# Patient Record
Sex: Female | Born: 1935 | Race: White | Hispanic: Yes | Smoking: Never smoker
Health system: Southern US, Community
[De-identification: ages and names within clinical notes are randomized; demographics above are authoritative.]

## PROBLEM LIST (undated history)

## (undated) DIAGNOSIS — E079 Disorder of thyroid, unspecified: Secondary | ICD-10-CM

## (undated) DIAGNOSIS — G309 Alzheimer's disease, unspecified: Secondary | ICD-10-CM

## (undated) DIAGNOSIS — M1990 Unspecified osteoarthritis, unspecified site: Secondary | ICD-10-CM

## (undated) DIAGNOSIS — F0280 Dementia in other diseases classified elsewhere without behavioral disturbance: Secondary | ICD-10-CM

## (undated) HISTORY — PX: ABDOMINAL HYSTERECTOMY: SHX81

## (undated) HISTORY — DX: Unspecified osteoarthritis, unspecified site: M19.90

---

## 2016-02-28 ENCOUNTER — Ambulatory Visit (INDEPENDENT_AMBULATORY_CARE_PROVIDER_SITE_OTHER): Payer: Medicare (Managed Care) | Admitting: Family Medicine

## 2016-02-28 ENCOUNTER — Encounter: Payer: Self-pay | Admitting: Family Medicine

## 2016-02-28 VITALS — BP 128/62 | HR 66 | Temp 98.3°F | Resp 12 | Wt 115.0 lb

## 2016-02-28 DIAGNOSIS — Z23 Encounter for immunization: Secondary | ICD-10-CM

## 2016-02-28 DIAGNOSIS — R5383 Other fatigue: Secondary | ICD-10-CM

## 2016-02-28 DIAGNOSIS — R1030 Lower abdominal pain, unspecified: Secondary | ICD-10-CM | POA: Diagnosis not present

## 2016-02-28 DIAGNOSIS — Z7689 Persons encountering health services in other specified circumstances: Secondary | ICD-10-CM

## 2016-02-28 DIAGNOSIS — Z8639 Personal history of other endocrine, nutritional and metabolic disease: Secondary | ICD-10-CM

## 2016-02-28 DIAGNOSIS — G3184 Mild cognitive impairment, so stated: Secondary | ICD-10-CM | POA: Diagnosis not present

## 2016-02-28 DIAGNOSIS — F422 Mixed obsessional thoughts and acts: Secondary | ICD-10-CM | POA: Diagnosis not present

## 2016-02-28 DIAGNOSIS — M1990 Unspecified osteoarthritis, unspecified site: Secondary | ICD-10-CM

## 2016-02-28 MED ORDER — VENLAFAXINE HCL 25 MG PO TABS: 25.0000 mg | ORAL_TABLET | Freq: Every day | ORAL | 5 refills | Status: DC

## 2016-02-28 MED ORDER — GALANTAMINE HYDROBROMIDE 8 MG PO TABS: 8.0000 mg | ORAL_TABLET | Freq: Every day | ORAL | 5 refills | Status: DC
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
## 2016-02-29 DIAGNOSIS — Z23 Encounter for immunization: Secondary | ICD-10-CM | POA: Diagnosis not present

## 2016-03-01 ENCOUNTER — Telehealth: Payer: Self-pay | Admitting: Emergency Medicine

## 2016-03-01 DIAGNOSIS — E039 Hypothyroidism, unspecified: Secondary | ICD-10-CM

## 2016-03-01 LAB — COMPREHENSIVE METABOLIC PANEL
ALT: 13 IU/L (ref 0–32)
AST: 18 IU/L (ref 0–40)
Albumin/Globulin Ratio: 1.6 (ref 1.2–2.2)
Albumin: 4.7 g/dL (ref 3.5–4.7)
Alkaline Phosphatase: 83 IU/L (ref 39–117)
BILIRUBIN TOTAL: 0.3 mg/dL (ref 0.0–1.2)
BUN/Creatinine Ratio: 40 — ABNORMAL HIGH (ref 12–28)
BUN: 29 mg/dL — AB (ref 8–27)
CHLORIDE: 100 mmol/L (ref 96–106)
CO2: 23 mmol/L (ref 18–29)
Calcium: 10 mg/dL (ref 8.7–10.3)
Creatinine, Ser: 0.72 mg/dL (ref 0.57–1.00)
GFR calc non Af Amer: 77 mL/min/{1.73_m2} (ref >59)
GFR, EST AFRICAN AMERICAN: 89 mL/min/{1.73_m2} (ref >59)
GLUCOSE: 93 mg/dL (ref 65–99)
Globulin, Total: 2.9 g/dL (ref 1.5–4.5)
Potassium: 5.1 mmol/L (ref 3.5–5.2)
Sodium: 146 mmol/L — ABNORMAL HIGH (ref 134–144)
TOTAL PROTEIN: 7.6 g/dL (ref 6.0–8.5)

## 2016-03-01 LAB — CBC WITH DIFFERENTIAL/PLATELET
BASOS ABS: 0 10*3/uL (ref 0.0–0.2)
Basos: 0 %
EOS (ABSOLUTE): 0.1 10*3/uL (ref 0.0–0.4)
Eos: 1 %
Hematocrit: 40.4 % (ref 34.0–46.6)
Hemoglobin: 13.1 g/dL (ref 11.1–15.9)
IMMATURE GRANS (ABS): 0 10*3/uL (ref 0.0–0.1)
IMMATURE GRANULOCYTES: 0 %
LYMPHS: 16 %
Lymphocytes Absolute: 1.5 10*3/uL (ref 0.7–3.1)
MCH: 27.1 pg (ref 26.6–33.0)
MCHC: 32.4 g/dL (ref 31.5–35.7)
MCV: 84 fL (ref 79–97)
Monocytes Absolute: 0.6 10*3/uL (ref 0.1–0.9)
Monocytes: 6 %
NEUTROS PCT: 77 %
Neutrophils Absolute: 7.5 10*3/uL — ABNORMAL HIGH (ref 1.4–7.0)
PLATELETS: 291 10*3/uL (ref 150–379)
RBC: 4.84 x10E6/uL (ref 3.77–5.28)
RDW: 14.1 % (ref 12.3–15.4)
WBC: 9.8 10*3/uL (ref 3.4–10.8)

## 2016-03-01 LAB — URINALYSIS, COMPLETE
Bilirubin, UA: NEGATIVE
Glucose, UA: NEGATIVE
KETONES UA: NEGATIVE
NITRITE UA: NEGATIVE
RBC, UA: NEGATIVE
Specific Gravity, UA: 1.027 (ref 1.005–1.030)
UUROB: 0.2 mg/dL (ref 0.2–1.0)
pH, UA: 5 (ref 5.0–7.5)

## 2016-03-01 LAB — MICROSCOPIC EXAMINATION

## 2016-03-01 LAB — URINE CULTURE

## 2016-03-01 LAB — TSH: TSH: 8.96 u[IU]/mL — AB (ref 0.450–4.500)

## 2016-03-01 MED ORDER — LEVOTHYROXINE SODIUM 25 MCG PO TABS: 25.0000 ug | ORAL_TABLET | Freq: Every day | ORAL | 12 refills | Status: DC

## 2016-03-01 NOTE — Telephone Encounter (Signed)
pt daughter informed. Med sent in.

## 2016-03-01 NOTE — Telephone Encounter (Signed)
-----   Message from Maple Hudsonichard L Gilbert Jr., MD sent at 03/01/2016  7:40 AM EST ----- Labs OK--mildly dehydrated--push fluids a little. Mildly hypothyroid--synthroid 25mcg daily.

## 2016-03-04 ENCOUNTER — Other Ambulatory Visit: Payer: Self-pay

## 2016-03-04 ENCOUNTER — Telehealth: Payer: Self-pay | Admitting: Family Medicine

## 2016-03-04 NOTE — Telephone Encounter (Signed)
Pt's son in law called saying he thinks pt had a bad reaction to the venlafaxine (EFFEXOR) 25 MG tablet  He said last night she had to be restrained. She was combative.  Please advise.  [phone]  Thanks Barth Kirksteri

## 2016-03-04 NOTE — Telephone Encounter (Signed)
I do not think it is from the Effexor but go ahead and stop it anyway. She is on a  very small dose. I think the patient is actually sundowning with her dementia.

## 2016-03-04 NOTE — Telephone Encounter (Signed)
Not a UTI by culture.

## 2016-03-04 NOTE — Telephone Encounter (Signed)
Please review-aa 

## 2016-03-04 NOTE — Telephone Encounter (Signed)
Gary-son in Social workerlaw, Bonneyadvised.-aa

## 2016-03-04 NOTE — Telephone Encounter (Signed)
Pt daughter informed. [place] and she said that it could be a UTI. Please review her urine culture.   Call Jillyn HiddenGary (son in law) at [phone] with further info.

## 2016-03-11 ENCOUNTER — Ambulatory Visit (INDEPENDENT_AMBULATORY_CARE_PROVIDER_SITE_OTHER): Payer: Medicare (Managed Care) | Admitting: Family Medicine

## 2016-03-11 ENCOUNTER — Ambulatory Visit: Payer: Medicare (Managed Care) | Admitting: Family Medicine

## 2016-03-11 VITALS — BP 88/46 | HR 94 | Temp 97.7°F | Resp 16

## 2016-03-11 DIAGNOSIS — E039 Hypothyroidism, unspecified: Secondary | ICD-10-CM | POA: Diagnosis not present

## 2016-03-11 DIAGNOSIS — G301 Alzheimer's disease with late onset: Secondary | ICD-10-CM

## 2016-03-11 DIAGNOSIS — F0280 Dementia in other diseases classified elsewhere without behavioral disturbance: Secondary | ICD-10-CM | POA: Diagnosis not present

## 2016-03-11 NOTE — Progress Notes (Signed)
Alejandra Armstrong  MRN: [id number] DOB: 10/21/1929  Subjective:  HPI   The patient is an 82 year old female who presents for follow up of her last visit.  The daughter is present and reports that the patient became very delusional after beginning the medication that she was started on during the last visit.  They have discontinued all those medications.  She seems to be slightly better.  The patient reports she is feeling sleeping well and feels well emotionally.  The daughter reports that the patient has been going to bed at 5 pm and then waking at 9 pm and having trouble with sleeping the rest of the night.She Is presently on no medications.  Patient Active Problem List   Diagnosis Date Noted  . Arthritis 02/28/2016    Past Medical History:  Diagnosis Date  . Arthritis     Social History   Social History  . Marital status: Widowed    Spouse name: N/A  . Number of children: N/A  . Years of education: N/A   Occupational History  . Not on file.   Social History Main Topics  . Smoking status: Never Smoker  . Smokeless tobacco: Never Used  . Alcohol use No  . Drug use: No  . Sexual activity: No   Other Topics Concern  . Not on file   Social History Narrative  . No narrative on file    Outpatient Encounter Prescriptions as of 03/11/2016  Medication Sig  . galantamine (RAZADYNE) 8 MG tablet Take 1 tablet (8 mg total) by mouth daily.  Marland Kitchen. levothyroxine (SYNTHROID, LEVOTHROID) 25 MCG tablet Take 1 tablet (25 mcg total) by mouth daily before breakfast.   No facility-administered encounter medications on file as of 03/11/2016.     Allergies  Allergen Reactions  . Venlafaxine Hcl Other (See Comments)    Hallucinations, combative behavior    Review of Systems  Constitutional: Negative.   HENT: Negative.   Eyes: Negative.   Respiratory: Negative.   Cardiovascular: Negative.   Gastrointestinal: Negative.   Genitourinary: Negative.   Skin: Negative.     Endo/Heme/Allergies: Negative.   Psychiatric/Behavioral: Positive for memory loss.    Objective:  BP (!) 88/46   Pulse 94   Temp 97.7 F (36.5 C) (Oral)   Resp 16   Physical Exam  Constitutional: She is well-developed, well-nourished, and in no distress.  HENT:  Head: Normocephalic and atraumatic.  Right Ear: External ear normal.  Left Ear: External ear normal.  Nose: Nose normal.  Eyes: Conjunctivae are normal. Pupils are equal, round, and reactive to light.  Neck: Normal range of motion.  Cardiovascular: Normal rate, regular rhythm and normal heart sounds.   Pulmonary/Chest: Effort normal and breath sounds normal.  Abdominal: Soft.  Musculoskeletal: She exhibits no edema or deformity.  Neurological: She is alert. No cranial nerve deficit. Gait normal. Coordination normal.  Skin: Skin is warm and dry.  Psychiatric: Mood and affect normal.    Assessment and Plan :  Alzheimer's dementia We'll try to get MMSE on next visit. Top all medications for now. May need to try to get placement for assisted living due to social situation. Both the daughter and son-in-law continue to work and the grandson is in school. For  disrupted sleep we'll try to put her put her bed time back from 5 PM to 9 PM. I doubt it will be successful but it is worth a try. Hypothyroidism TSH sometime early in 2018.  I have  done the exam and reviewed the chart and it is accurate to the best of my knowledge. Dentist[employer]agon  technology has been used and  any errors in dictation or transcription are unintentional. Julieanne Mansonichard  M.D. [facility]  Return to clinic 1 month.   The patient has discontinued all her medication and at this point she states she feels well.  We will follow her thyroid level and adjust according. The patient has been advised to try and stay awake until later in the evening. Family has been advised to gradually keep her awake a little longer each  night.

## 2016-03-27 ENCOUNTER — Telehealth: Payer: Self-pay

## 2016-03-27 DIAGNOSIS — Z111 Encounter for screening for respiratory tuberculosis: Secondary | ICD-10-CM

## 2016-03-27 NOTE — Telephone Encounter (Signed)
TB quantiferon test ordered. Lab slip printed at front desk. Jillyn HiddenGary advised.

## 2016-03-27 NOTE — Telephone Encounter (Signed)
Easiest for them would be Quantiferon gold blood test.

## 2016-03-27 NOTE — Telephone Encounter (Signed)
Patient's son in law Sena HitchGary Walters is requesting a order to have a TB test done. [facility] will be [facility] soon and it is a requirement along with a FL2 form. Jillyn HiddenGary will drop off the form to be completed. CB# [phone]

## 2016-04-03 ENCOUNTER — Other Ambulatory Visit: Payer: Self-pay

## 2016-04-03 DIAGNOSIS — F422 Mixed obsessional thoughts and acts: Secondary | ICD-10-CM

## 2016-04-03 DIAGNOSIS — E039 Hypothyroidism, unspecified: Secondary | ICD-10-CM

## 2016-04-03 DIAGNOSIS — F0280 Dementia in other diseases classified elsewhere without behavioral disturbance: Secondary | ICD-10-CM

## 2016-04-03 DIAGNOSIS — G301 Alzheimer's disease with late onset: Principal | ICD-10-CM

## 2016-04-05 ENCOUNTER — Telehealth: Payer: Self-pay | Admitting: Family Medicine

## 2016-04-05 NOTE — Telephone Encounter (Signed)
Pt's son in law called wanting to get the TB skin test results.  Please advise [phone]  Thanks teri

## 2016-04-06 LAB — QUANTIFERON IN TUBE
QFT TB AG MINUS NIL VALUE: 0.04 [IU]/mL
QUANTIFERON MITOGEN VALUE: 2.78 IU/mL
QUANTIFERON NIL VALUE: 0.03 [IU]/mL
QUANTIFERON TB AG VALUE: 0.07 IU/mL
QUANTIFERON TB GOLD: NEGATIVE

## 2016-04-06 LAB — QUANTIFERON TB GOLD ASSAY (BLOOD)

## 2016-04-08 NOTE — Telephone Encounter (Signed)
Alejandra HiddenGary walters advised-aa

## 2016-04-16 ENCOUNTER — Ambulatory Visit: Payer: Medicare (Managed Care) | Admitting: Family Medicine

## 2016-07-03 ENCOUNTER — Telehealth: Payer: Self-pay | Admitting: Family Medicine

## 2016-07-03 NOTE — Telephone Encounter (Signed)
It came in last week or at the end of other week and I placed it for you to review on green chart. If you could please check. Thank you-aa

## 2016-07-03 NOTE — Telephone Encounter (Signed)
Jillyn Hidden calling to check stats on the 2 forms that were dropped off about 2 week ago to be filled out by Dr. Please call Jillyn Hidden when ready for pick up.  CB# [phone] Thanks CC

## 2016-07-10 ENCOUNTER — Telehealth: Payer: Self-pay

## 2016-07-10 NOTE — Telephone Encounter (Signed)
I had a copy so I placed another copy on the green chart for you to review please-aa

## 2016-07-10 NOTE — Telephone Encounter (Signed)
Family is again calling for the form they dropped of to be filled out.  Please call Sena Hitch when form is ready at [phone]

## 2016-07-16 ENCOUNTER — Telehealth: Payer: Self-pay | Admitting: Family Medicine

## 2016-07-16 NOTE — Telephone Encounter (Signed)
Called Pt to schedule AWV with [facility] - knb °

## 2016-07-30 ENCOUNTER — Telehealth: Payer: Self-pay | Admitting: Family Medicine

## 2016-07-30 NOTE — Telephone Encounter (Signed)
Called Pt to schedule AWV with [facility] - knb °

## 2017-01-03 ENCOUNTER — Emergency Department: Payer: Medicare Other

## 2017-01-03 ENCOUNTER — Emergency Department
Admission: EM | Admit: 2017-01-03 | Discharge: 2017-01-03 | Disposition: A | Discharge status: Home / Self Care | Payer: Medicare Other | Attending: Student in an Organized Health Care Education/Training Program | Admitting: Student in an Organized Health Care Education/Training Program

## 2017-01-03 DIAGNOSIS — R55 Syncope and collapse: Secondary | ICD-10-CM | POA: Diagnosis present

## 2017-01-03 DIAGNOSIS — G308 Other Alzheimer's disease: Secondary | ICD-10-CM | POA: Insufficient documentation

## 2017-01-03 DIAGNOSIS — E039 Hypothyroidism, unspecified: Secondary | ICD-10-CM | POA: Insufficient documentation

## 2017-01-03 HISTORY — DX: Disorder of thyroid, unspecified: E07.9

## 2017-01-03 HISTORY — DX: Dementia in other diseases classified elsewhere, unspecified severity, without behavioral disturbance, psychotic disturbance, mood disturbance, and anxiety: F02.80

## 2017-01-03 HISTORY — DX: Alzheimer's disease, unspecified: G30.9

## 2017-01-03 LAB — CBC
HCT: 37 % (ref 35.0–47.0)
Hemoglobin: 12 g/dL (ref 12.0–16.0)
MCH: 26.1 pg (ref 26.0–34.0)
MCHC: 32.4 g/dL (ref 32.0–36.0)
MCV: 80.7 fL (ref 80.0–100.0)
PLATELETS: 228 10*3/uL (ref 150–440)
RBC: 4.58 MIL/uL (ref 3.80–5.20)
RDW: 15.5 % — ABNORMAL HIGH (ref 11.5–14.5)
WBC: 5.9 10*3/uL (ref 3.6–11.0)

## 2017-01-03 LAB — URINALYSIS, COMPLETE (UACMP) WITH MICROSCOPIC
BACTERIA UA: NONE SEEN
Bilirubin Urine: NEGATIVE
GLUCOSE, UA: NEGATIVE mg/dL
HGB URINE DIPSTICK: NEGATIVE
KETONES UR: 5 mg/dL — AB
Nitrite: NEGATIVE
PROTEIN: 100 mg/dL — AB
Specific Gravity, Urine: 1.021 (ref 1.005–1.030)
pH: 5 (ref 5.0–8.0)

## 2017-01-03 LAB — BASIC METABOLIC PANEL
Anion gap: 7 (ref 5–15)
BUN: 19 mg/dL (ref 6–20)
CALCIUM: 9.2 mg/dL (ref 8.9–10.3)
CO2: 27 mmol/L (ref 22–32)
CREATININE: 1.01 mg/dL — AB (ref 0.44–1.00)
Chloride: 109 mmol/L (ref 101–111)
GFR calc non Af Amer: 49 mL/min — ABNORMAL LOW (ref >60)
GFR, EST AFRICAN AMERICAN: 57 mL/min — AB (ref >60)
Glucose, Bld: 99 mg/dL (ref 65–99)
Potassium: 3.8 mmol/L (ref 3.5–5.1)
SODIUM: 143 mmol/L (ref 135–145)

## 2017-01-03 LAB — GLUCOSE, CAPILLARY: GLUCOSE-CAPILLARY: 102 mg/dL — AB (ref 65–99)

## 2017-01-03 LAB — TROPONIN I: Troponin I: <0.03 ng/mL (ref <0.03)

## 2017-01-03 MED ORDER — SODIUM CHLORIDE 0.9 % IV BOLUS (SEPSIS)
500.0000 mL | Freq: Once | INTRAVENOUS | Status: AC
  Administered 2017-01-03: 500 mL via INTRAVENOUS

## 2017-01-03 NOTE — ED Provider Notes (Signed)
-----------------------------------------   8:53 AM on 01/03/2017 -----------------------------------------  Patient's labs are largely within normal limits. Patient's symptoms are suggestive of vasovagal/orthostatic syncope. With a normal workup as the patient continues to appear very well in the emergency department we will discharge home with PCP follow-up. Awaiting Spanish interpreter.   Minna Antis, MD 01/03/17 [phone]

## 2017-01-03 NOTE — ED Notes (Signed)
Patient transported to CT 

## 2017-01-03 NOTE — ED Triage Notes (Signed)
Patient coming from [facility]. 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 Patient had syncopal episode while being ambulated by staff to bathroom. Patient was eased down to floor by staff.

## 2017-01-03 NOTE — ED Notes (Signed)
Spoke with Director Liborio Nixon from [place] with update on patient. Liborio Nixon reported [facility] will come and pick up patient when she is ready for d/c

## 2017-01-03 NOTE — ED Provider Notes (Signed)
[facility] Emergency Department Provider Note    First MD Initiated Contact with Patient 01/03/17 [phone]     (approximate)  I have reviewed the triage vital signs and the nursing notes.   HISTORY  Chief Complaint Loss of Consciousness  Level V Caveat: Dementia  HPI Alejandra Armstrong is a 83 y.o. female presents from [facility]. Patient has a history of Alzheimer's dementia. She is being assisted to the restroom early this morningwhen she had a witnessed near syncopal episode where she reportedly tightened up and staff had to help her to the floor. There is no shaking episode to suggest seizure activity. She did move her bowels. Patient was hemodynamically stable in route. No recent changes to any medications. Cannot provide any additional history due to dementia.   Past Medical History:  Diagnosis Date  . Alzheimer's dementia   . Arthritis   . Thyroid disease    hypothyroidism   Family History  Problem Relation Age of Onset  . Dementia Mother   . Depression Father   . Dementia Sister   . Colon cancer Sister    Past Surgical History:  Procedure Laterality Date  . ABDOMINAL HYSTERECTOMY     Patient Active Problem List   Diagnosis Date Noted  . Late onset Alzheimer's disease without behavioral disturbance 04/03/2016  . Hypothyroidism 04/03/2016  . Mixed obsessional thoughts and acts 04/03/2016  . Arthritis 02/28/2016      Prior to Admission medications   Medication Sig Start Date End Date Taking? Authorizing Provider  galantamine (RAZADYNE) 8 MG tablet Take 1 tablet (8 mg total) by mouth daily. Patient not taking: Reported on 03/11/2016 02/28/16   Maple Hudson., MD  levothyroxine (SYNTHROID, LEVOTHROID) 25 MCG tablet Take 1 tablet (25 mcg total) by mouth daily before breakfast. Patient not taking: Reported on 03/11/2016 03/01/16   Maple Hudson., MD    Allergies Venlafaxine hcl    Social  History Social History  Substance Use Topics  . Smoking status: Never Smoker  . Smokeless tobacco: Never Used  . Alcohol use No    Review of Systems Patient denies headaches, rhinorrhea, blurry vision, numbness, shortness of breath, chest pain, edema, cough, abdominal pain, nausea, vomiting, diarrhea, dysuria, fevers, rashes or hallucinations unless otherwise stated above in HPI. ____________________________________________   PHYSICAL EXAM:  VITAL SIGNS: Vitals:   01/03/17 0605 01/03/17 0608  BP:  (!) 113/56  Pulse:  74  Resp:  19  Temp:  99.2 F (37.3 C)  SpO2: 94% 95%    Constitutional: Alert and in no acute distress. Eyes: Conjunctivae are normal.  Head: Atraumatic. Nose: No congestion/rhinnorhea. Mouth/Throat: Mucous membranes are moist.   Neck: No stridor. Painless ROM.  Cardiovascular: Normal rate, regular rhythm. Grossly normal heart sounds.  Good peripheral circulation. Respiratory: Normal respiratory effort.  No retractions. Lungs CTAB. Gastrointestinal: Soft and nontender. No distention. No abdominal bruits. No CVA tenderness. Genitourinary: normal external genitalia Musculoskeletal: No lower extremity tenderness nor edema.  No joint effusions. Neurologic:  No gross focal neurologic deficits are appreciated. No facial droop Skin:  Skin is warm, dry and intact. No rash noted. Psychiatric: appropriate  ____________________________________________   LABS (all labs ordered are listed, but only abnormal results are displayed)  Results for orders placed or performed during the hospital encounter of 01/03/17 (from the past 24 hour(s))  Glucose, capillary     Status: Abnormal   Collection Time: 01/03/17  6:15 AM  [facility] Value Ref  Range   Glucose-Capillary 102 (H) 65 - 99 mg/dL   ____________________________________________  EKG My review and personal interpretation at Time: 6:09   Indication: syncope  Rate: 75  Rhythm: sinus Axis: normal Other: normal  intervals, no stemi, no bradycardia or blocks. ____________________________________________  RADIOLOGY  I personally reviewed all radiographic images ordered to evaluate for the above acute complaints and reviewed radiology reports and findings.  These findings were personally discussed with the patient.  Please see medical record for radiology report.  ____________________________________________   PROCEDURES  Procedure(s) performed:  Procedures    Critical Care performed: no ____________________________________________   INITIAL IMPRESSION / ASSESSMENT AND PLAN / ED COURSE  Pertinent labs & imaging results that were available during my care of the patient were reviewed by me and considered in my medical decision making (see chart for details).  DDX: vasovagal, dehydration, cva, bleed, dysrhythmia  Alejandra Armstrong is a 83 y.o. who presents to the ED with syncopal episode as described above. Patient frail appearing but nontoxic. No hypoxia. She is well-perfused. EKG shows no evidence of acute ischemia. No bradycardia or abnormal intervals. Stool is guaiac negative. No evidence of UTI. Patient was moving her bowels during episode therefore do suspect some component of vasovagal. We'll give IV fluids. CT head ordered to evaluate for bleed or CVA shows no acute abnormality. Chest x-ray shows no evidence of congestive heart failure or pneumothorax. Patient will be signed out to Dr. Dennie Bible housekeeping pending results of BMP and troponin and reassessment. I do anticipate the patient will be stable for discharge back to facility.      ____________________________________________   FINAL CLINICAL IMPRESSION(S) / ED DIAGNOSES  Final diagnoses:  Fainting spell      NEW MEDICATIONS STARTED DURING THIS VISIT:  New Prescriptions   No medications on file     Note:  This document was prepared using Dragon voice recognition software and may include unintentional dictation  errors.    Willy Eddy, MD 01/03/17 [phone]

## 2017-01-13 ENCOUNTER — Emergency Department: Payer: Medicare Other

## 2017-01-13 ENCOUNTER — Observation Stay
Admission: EM | Admit: 2017-01-13 | Discharge: 2017-01-15 | Disposition: A | Discharge status: Home / Self Care | Payer: Medicare Other | Attending: Internal Medicine | Admitting: Internal Medicine

## 2017-01-13 ENCOUNTER — Encounter: Payer: Self-pay | Admitting: *Deleted

## 2017-01-13 DIAGNOSIS — Z23 Encounter for immunization: Secondary | ICD-10-CM | POA: Diagnosis not present

## 2017-01-13 DIAGNOSIS — N390 Urinary tract infection, site not specified: Secondary | ICD-10-CM | POA: Diagnosis not present

## 2017-01-13 DIAGNOSIS — F0281 Dementia in other diseases classified elsewhere with behavioral disturbance: Secondary | ICD-10-CM | POA: Insufficient documentation

## 2017-01-13 DIAGNOSIS — Z79899 Other long term (current) drug therapy: Secondary | ICD-10-CM | POA: Insufficient documentation

## 2017-01-13 DIAGNOSIS — G309 Alzheimer's disease, unspecified: Secondary | ICD-10-CM | POA: Diagnosis not present

## 2017-01-13 DIAGNOSIS — E039 Hypothyroidism, unspecified: Secondary | ICD-10-CM | POA: Diagnosis not present

## 2017-01-13 DIAGNOSIS — R4182 Altered mental status, unspecified: Secondary | ICD-10-CM | POA: Diagnosis present

## 2017-01-13 DIAGNOSIS — E860 Dehydration: Secondary | ICD-10-CM | POA: Diagnosis not present

## 2017-01-13 DIAGNOSIS — M1990 Unspecified osteoarthritis, unspecified site: Secondary | ICD-10-CM | POA: Insufficient documentation

## 2017-01-13 DIAGNOSIS — Z888 Allergy status to other drugs, medicaments and biological substances status: Secondary | ICD-10-CM | POA: Diagnosis not present

## 2017-01-13 DIAGNOSIS — R0602 Shortness of breath: Secondary | ICD-10-CM

## 2017-01-13 DIAGNOSIS — G9341 Metabolic encephalopathy: Secondary | ICD-10-CM | POA: Insufficient documentation

## 2017-01-13 LAB — COMPREHENSIVE METABOLIC PANEL
ALT: 9 U/L — AB (ref 14–54)
ANION GAP: 10 (ref 5–15)
AST: 29 U/L (ref 15–41)
Albumin: 3.7 g/dL (ref 3.5–5.0)
Alkaline Phosphatase: 80 U/L (ref 38–126)
BUN: 12 mg/dL (ref 6–20)
CHLORIDE: 106 mmol/L (ref 101–111)
CO2: 23 mmol/L (ref 22–32)
CREATININE: 1.07 mg/dL — AB (ref 0.44–1.00)
Calcium: 9.5 mg/dL (ref 8.9–10.3)
GFR, EST AFRICAN AMERICAN: 53 mL/min — AB (ref >60)
GFR, EST NON AFRICAN AMERICAN: 46 mL/min — AB (ref >60)
Glucose, Bld: 122 mg/dL — ABNORMAL HIGH (ref 65–99)
POTASSIUM: 5.2 mmol/L — AB (ref 3.5–5.1)
Sodium: 139 mmol/L (ref 135–145)
Total Bilirubin: 1.2 mg/dL (ref 0.3–1.2)
Total Protein: 7.6 g/dL (ref 6.5–8.1)

## 2017-01-13 LAB — URINALYSIS, ROUTINE W REFLEX MICROSCOPIC
BILIRUBIN URINE: NEGATIVE
Glucose, UA: NEGATIVE mg/dL
Ketones, ur: NEGATIVE mg/dL
Nitrite: NEGATIVE
PH: 6 (ref 5.0–8.0)
Protein, ur: 100 mg/dL — AB
SPECIFIC GRAVITY, URINE: 1.015 (ref 1.005–1.030)

## 2017-01-13 LAB — CBC
HCT: 40 % (ref 35.0–47.0)
Hemoglobin: 13.1 g/dL (ref 12.0–16.0)
MCH: 26.1 pg (ref 26.0–34.0)
MCHC: 32.6 g/dL (ref 32.0–36.0)
MCV: 79.9 fL — AB (ref 80.0–100.0)
PLATELETS: 226 10*3/uL (ref 150–440)
RBC: 5.01 MIL/uL (ref 3.80–5.20)
RDW: 15.5 % — ABNORMAL HIGH (ref 11.5–14.5)
WBC: 10.7 10*3/uL (ref 3.6–11.0)

## 2017-01-13 LAB — MRSA PCR SCREENING: MRSA by PCR: NEGATIVE

## 2017-01-13 MED ORDER — MEMANTINE HCL 5 MG PO TABS
10.0000 mg | ORAL_TABLET | Freq: Two times a day (BID) | ORAL | Status: DC
  Administered 2017-01-14 – 2017-01-15 (×3): 10 mg via ORAL
  Filled 2017-01-13 (×4): qty 2

## 2017-01-13 MED ORDER — CEFTRIAXONE SODIUM IN DEXTROSE 20 MG/ML IV SOLN
1.0000 g | INTRAVENOUS | Status: DC
  Filled 2017-01-13: qty 50

## 2017-01-13 MED ORDER — DEXTROSE 5 % IV SOLN
1.0000 g | INTRAVENOUS | Status: DC
  Administered 2017-01-14: 1 g via INTRAVENOUS
  Filled 2017-01-13 (×2): qty 10

## 2017-01-13 MED ORDER — TRAZODONE HCL 50 MG PO TABS: 12.5000 mg | ORAL_TABLET | Freq: Four times a day (QID) | ORAL | Status: DC | PRN

## 2017-01-13 MED ORDER — SODIUM CHLORIDE 0.9 % IV SOLN
INTRAVENOUS | Status: DC
  Administered 2017-01-13: 14:00:00 via INTRAVENOUS

## 2017-01-13 MED ORDER — ONDANSETRON HCL 4 MG PO TABS
4.0000 mg | ORAL_TABLET | Freq: Four times a day (QID) | ORAL | Status: DC | PRN
  Administered 2017-01-14: 4 mg via ORAL
  Filled 2017-01-13: qty 1

## 2017-01-13 MED ORDER — LEVOTHYROXINE SODIUM 50 MCG PO TABS
100.0000 ug | ORAL_TABLET | Freq: Every day | ORAL | Status: DC
  Administered 2017-01-14 – 2017-01-15 (×2): 100 ug via ORAL
  Filled 2017-01-13 (×2): qty 2

## 2017-01-13 MED ORDER — ACETAMINOPHEN 650 MG RE SUPP: 650.0000 mg | Freq: Four times a day (QID) | RECTAL | Status: DC | PRN

## 2017-01-13 MED ORDER — TRAZODONE HCL 50 MG PO TABS
25.0000 mg | ORAL_TABLET | Freq: Every day | ORAL | Status: DC
  Administered 2017-01-14: 25 mg via ORAL
  Filled 2017-01-13 (×2): qty 1

## 2017-01-13 MED ORDER — ENOXAPARIN SODIUM 40 MG/0.4ML ~~LOC~~ SOLN: 40.0000 mg | SUBCUTANEOUS | Status: DC

## 2017-01-13 MED ORDER — CEFTRIAXONE SODIUM IN DEXTROSE 20 MG/ML IV SOLN
1.0000 g | Freq: Once | INTRAVENOUS | Status: AC
  Administered 2017-01-13: 1 g via INTRAVENOUS
  Filled 2017-01-13: qty 50

## 2017-01-13 MED ORDER — INFLUENZA VAC SPLIT HIGH-DOSE 0.5 ML IM SUSY
0.5000 mL | PREFILLED_SYRINGE | INTRAMUSCULAR | Status: AC
  Administered 2017-01-15: 0.5 mL via INTRAMUSCULAR
  Filled 2017-01-13: qty 0.5

## 2017-01-13 MED ORDER — TRAZODONE 25 MG HALF TABLET
12.5000 mg | ORAL_TABLET | Freq: Four times a day (QID) | ORAL | Status: DC | PRN
  Filled 2017-01-13: qty 1

## 2017-01-13 MED ORDER — SERTRALINE HCL 50 MG PO TABS
75.0000 mg | ORAL_TABLET | Freq: Every day | ORAL | Status: DC
  Administered 2017-01-14 – 2017-01-15 (×2): 75 mg via ORAL
  Filled 2017-01-13 (×2): qty 2

## 2017-01-13 MED ORDER — ONDANSETRON HCL 4 MG/2ML IJ SOLN: 4.0000 mg | Freq: Four times a day (QID) | INTRAMUSCULAR | Status: DC | PRN

## 2017-01-13 MED ORDER — VITAMIN D 1000 UNITS PO TABS
2000.0000 [IU] | ORAL_TABLET | Freq: Every day | ORAL | Status: DC
Start: 2017-01-13 — End: 2017-01-15
  Administered 2017-01-14 – 2017-01-15 (×2): 2000 [IU] via ORAL
  Filled 2017-01-13 (×2): qty 2

## 2017-01-13 MED ORDER — ACETAMINOPHEN 325 MG PO TABS: 650.0000 mg | ORAL_TABLET | Freq: Four times a day (QID) | ORAL | Status: DC | PRN

## 2017-01-13 MED ORDER — RISPERIDONE 0.25 MG PO TABS
0.7500 mg | ORAL_TABLET | Freq: Every day | ORAL | Status: DC
  Administered 2017-01-14: 0.75 mg via ORAL
  Filled 2017-01-13 (×2): qty 3

## 2017-01-13 NOTE — ED Notes (Signed)
A woman stating she was patient's daughter called to get information, states that she in on the way and will arrive between 1430-1530. Woman states that her name is Belva Cromeydia Walters and that she is patient's daughter. Explained HIPPA to patient's daughter and that all I could say over the phone without verification is that patient is here, going to be admitted, and that patient is stable.

## 2017-01-13 NOTE — H&P (Signed)
[employer] [facility] [place] [facility]   PATIENT NAME: Alejandra Armstrong    MR#:  [id number]  DATE OF BIRTH:  05/09/1929  DATE OF ADMISSION:  01/13/2017  PRIMARY CARE PHYSICIAN: Maple HudsonGilbert, Richard L Jr., MD   REQUESTING/REFERRING PHYSICIAN: Dr. Lenard LancePaduchowski  CHIEF COMPLAINT:   Chief Complaint  Patient presents with  . Fall  . Altered Mental Status    HISTORY OF PRESENT ILLNESS:  Alejandra CatchingsLuz Keep  is a 83 y.o. female with a known history of Alzheimer's dementia, hypothyroidism, osteoarthritis who presents from assisted living due to altered mental status. Patient herself has baseline dementia therefore most of the history obtained from the chart and from the ER physician. I attempted to call [facility] but did not get an answer from them. Patient presented to the emergency room was noted to have a urinary tract infection based on urinalysis. She is afebrile, with a normal white cell count and hemodynamically stable. Given her mental status change and metabolic encephalopathy hospitalist services were contacted further treatment and evaluation.  PAST MEDICAL HISTORY:   Past Medical History:  Diagnosis Date  . Alzheimer's dementia   . Arthritis   . Thyroid disease    hypothyroidism    PAST SURGICAL HISTORY:   Past Surgical History:  Procedure Laterality Date  . ABDOMINAL HYSTERECTOMY      SOCIAL HISTORY:   Social History  Substance Use Topics  . Smoking status: Never Smoker  . Smokeless tobacco: Never Used  . Alcohol use No    FAMILY HISTORY:   Family History  Problem Relation Age of Onset  . Dementia Mother   . Depression Father   . Dementia Sister   . Colon cancer Sister     DRUG ALLERGIES:   Allergies  Allergen Reactions  . Venlafaxine Hcl Other (See Comments)    Hallucinations, combative behavior    REVIEW OF SYSTEMS:   Review of Systems  Unable to perform ROS: Dementia    MEDICATIONS AT HOME:   Prior to Admission medications    Medication Sig Start Date End Date Taking? Authorizing Provider  Cholecalciferol (VITAMIN D3) 2000 units TABS Take 2,000 Units by mouth daily.   Yes [provider]  levothyroxine (SYNTHROID, LEVOTHROID) 100 MCG tablet Take 100 mcg by mouth daily before breakfast.   Yes [provider]  memantine (NAMENDA) 10 MG tablet Take 10 mg by mouth 2 (two) times daily.   Yes [provider]  risperiDONE (RISPERDAL) 0.5 MG tablet Take 0.75 mg by mouth at bedtime.   Yes [provider]  sertraline (ZOLOFT) 50 MG tablet Take 75 mg by mouth daily.   Yes [provider]  traZODone (DESYREL) 50 MG tablet Take 25 mg by mouth at bedtime.   Yes [provider]  traZODone (DESYREL) 50 MG tablet Take 12.5 mg by mouth every 6 (six) hours as needed (Agitation).   Yes [provider]  galantamine (RAZADYNE) 8 MG tablet Take 1 tablet (8 mg total) by mouth daily. Patient not taking: Reported on 03/11/2016 02/28/16   Maple HudsonGilbert, Richard L Jr., MD  levothyroxine (SYNTHROID, LEVOTHROID) 25 MCG tablet Take 1 tablet (25 mcg total) by mouth daily before breakfast. Patient not taking: Reported on 03/11/2016 03/01/16   Maple HudsonGilbert, Richard L Jr., MD      VITAL SIGNS:  Blood pressure (!) 110/51, pulse 77, temperature (!) 97.4 F (36.3 C), temperature source Oral, resp. rate 14, height 5' (1.524 m), weight 52.2 kg (115 lb), SpO2 99 %.  PHYSICAL EXAMINATION:  Physical Exam  GENERAL:  83 y.o.-year-old patient lying in the bed lethargic/encephalopathic EYES: Pupils equal, round, reactive to light and accommodation. No scleral icterus. Extraocular muscles intact.  HEENT: Head atraumatic, normocephalic. Oropharynx and nasopharynx clear. No oropharyngeal erythema, dry oral mucosa  NECK:  Supple, no jugular venous distention. No thyroid enlargement, no tenderness.  LUNGS: Normal breath sounds bilaterally, no wheezing, rales, rhonchi. No use of accessory muscles of respiration.   CARDIOVASCULAR: S1, S2 RRR. No murmurs, rubs, gallops, clicks.  ABDOMEN: Soft, nontender, nondistended. Bowel sounds present. No organomegaly or mass.  EXTREMITIES: No pedal edema, cyanosis, or clubbing. + 2 pedal & radial pulses b/l.   NEUROLOGIC: Cranial nerves II through XII are intact. No focal Motor or sensory deficits appreciated b/l. Globally weak & Encephalopathic. Responds to painful and verbal stimuli. PSYCHIATRIC: The patient is alert and oriented x 1.  SKIN: No obvious rash, lesion, or ulcer.   LABORATORY PANEL:   CBC  Recent Labs Lab 01/13/17 0707  WBC 10.7  HGB 13.1  HCT 40.0  PLT 226   ------------------------------------------------------------------------------------------------------------------  Chemistries   Recent Labs Lab 01/13/17 0707  NA 139  K 5.2*  CL 106  CO2 23  GLUCOSE 122*  BUN 12  CREATININE 1.07*  CALCIUM 9.5  AST 29  ALT 9*  ALKPHOS 80  BILITOT 1.2   ------------------------------------------------------------------------------------------------------------------  Cardiac Enzymes No results for input(s): TROPONINI in the last 168 hours. ------------------------------------------------------------------------------------------------------------------  RADIOLOGY:  Ct Head Wo Contrast  Result Date: 01/13/2017 CLINICAL DATA:  Altered mental status EXAM: CT HEAD WITHOUT CONTRAST TECHNIQUE: Contiguous axial images were obtained from the base of the skull through the vertex without intravenous contrast. COMPARISON:  05/06/2016 FINDINGS: Brain: There is atrophy and chronic small vessel disease changes. No acute intracranial abnormality. Specifically, no hemorrhage, hydrocephalus, mass lesion, acute infarction, or significant intracranial injury. Vascular: No hyperdense vessel or unexpected calcification. Skull: No acute calvarial abnormality. Sinuses/Orbits: Visualized paranasal sinuses and mastoids clear. Orbital soft tissues unremarkable.  Other: None IMPRESSION: No acute intracranial abnormality. Atrophy, chronic microvascular disease. Electronically Signed   By: Charlett Nose M.D.   On: 01/13/2017 08:12     IMPRESSION AND PLAN:   83 year old female with past medical history of Alzheimer's dementia, hypothyroidism, osteoarthritis who presents to the hospital due to altered mental status.  1. Altered mental status-metabolic encephalopathy secondary to urinary tract infection. Patient CT head was negative for acute pathology. -We'll treat the patient with IV fluids, IV ceftriaxone for UTI and follow mental status.  2. Urinary tract infection-based on urinalysis. We'll treat the patient with IV ceftriaxone, follow urine cultures.  3. Hypothyroidism-continue Synthroid.  4. Dementia with behavioral disturbance-continue Namenda, Risperdal, Zoloft.    All the records are reviewed and case discussed with ED provider. Management plans discussed with the patient, family and they are in agreement.  CODE STATUS: Full code  TOTAL TIME TAKING CARE OF THIS PATIENT: 45 minutes.    Houston Siren M.D on 01/13/2017 at 11:14 AM  Between 7am to 6pm - Pager - [phone]  After 6pm go to [email or web address] - password [email or web address] [facility]  [facility]  Office  [phone]  CC: Primary care physician; Maple Hudson., MD

## 2017-01-13 NOTE — ED Provider Notes (Signed)
[facility] Emergency Department Provider Note  Time seen: 7:17 AM  I have reviewed the triage vital signs and the nursing notes.   HISTORY  Chief Complaint Fall and Altered Mental Status    HPI Alejandra Armstrong is a 83 y.o. female with a past medical history of dementia, arthritis, hypothyroidism, presents to the emergency department for altered mental status.  According EMS report the patient had a fall from a seated position this morning hitting the right side of her head.  EMS also reports today nursing home staff states the patient is less interactive and responsive than normal.  Has baseline dementia which is significant but is normally interactive.  Today the patient is not very interactive.  Patient cannot contribute to her history.  Patient is somnolent but no distress.  Opens eyes to voice.   Past Medical History:  Diagnosis Date  . Alzheimer's dementia   . Arthritis   . Thyroid disease    hypothyroidism    Patient Active Problem List   Diagnosis Date Noted  . Late onset Alzheimer's disease without behavioral disturbance 04/03/2016  . Hypothyroidism 04/03/2016  . Mixed obsessional thoughts and acts 04/03/2016  . Arthritis 02/28/2016    Past Surgical History:  Procedure Laterality Date  . ABDOMINAL HYSTERECTOMY      Prior to Admission medications   Medication Sig Start Date End Date Taking? Authorizing Provider  galantamine (RAZADYNE) 8 MG tablet Take 1 tablet (8 mg total) by mouth daily. Patient not taking: Reported on 03/11/2016 02/28/16   Maple Hudson., MD  levothyroxine (SYNTHROID, LEVOTHROID) 25 MCG tablet Take 1 tablet (25 mcg total) by mouth daily before breakfast. Patient not taking: Reported on 03/11/2016 03/01/16   Maple Hudson., MD    Allergies  Allergen Reactions  . Venlafaxine Hcl Other (See Comments)    Hallucinations, combative behavior    Family History  Problem Relation Age of Onset  . Dementia  Mother   . Depression Father   . Dementia Sister   . Colon cancer Sister     Social History Social History  Substance Use Topics  . Smoking status: Never Smoker  . Smokeless tobacco: Never Used  . Alcohol use No    Review of Systems  Unable to obtain an adequate/accurate review of systems due to underlying dementia.  ____________________________________________   PHYSICAL EXAM:  VITAL SIGNS: ED Triage Vitals  Enc Vitals Group     BP 01/13/17 0643 136/68     Pulse Rate 01/13/17 0643 87     Resp 01/13/17 0643 18     Temp 01/13/17 0643 (!) 97.4 F (36.3 C)     Temp Source 01/13/17 0643 Oral     SpO2 01/13/17 0635 98 %     Weight 01/13/17 0644 115 lb (52.2 kg)     Height 01/13/17 0644 5' (1.524 m)     Head Circumference --      Peak Flow --      Pain Score --      Pain Loc --      Pain Edu? --      Excl. in GC? --     Constitutional: Somnolent but does open eyes to voice.  No distress. Eyes: Normal exam ENT   Head: Normocephalic and atraumatic.  No hematoma or abrasion.   Mouth/Throat: Mucous membranes are moist. Cardiovascular: Normal rate, regular rhythm.  Respiratory: Normal respiratory effort without tachypnea nor retractions. Breath sounds are clear  Gastrointestinal: Soft  and nontender. No distention.   Musculoskeletal: Nontender with normal range of motion in all extremities.  Neurologic: Somnolent, does not speak.  We will not follow commands.  Will open eyes to voice.  Unable to adequately evaluate neurological exam at this time. Skin:  Skin is warm, dry and intact.  Psychiatric: Somnolent.  ____________________________________________    EKG  EKG reviewed and interpreted by myself appears to show a regular/sinus rhythm at 87 bpm with a narrow QRS, normal axis, largely normal intervals and nonspecific ST changes.  Mild electrical interference, no significant ST elevation.  ____________________________________________    RADIOLOGY  CT shows  no acute abnormality  ____________________________________________   INITIAL IMPRESSION / ASSESSMENT AND PLAN / ED COURSE  Pertinent labs & imaging results that were available during my care of the patient were reviewed by me and considered in my medical decision making (see chart for details).  Patient presents to the emergency department with reported altered mental status.  Patient does have baseline dementia.  Differential this time would include head injury, ICH, electrolyte abnormality, infectious etiology, dementia.  We will obtain a CT scan of the head, labs and closely monitor.  I have reviewed the patient's records, patient was last seen in the emergency department approximately 10 days ago.  At that time the dementia is also well documented that the patient is unable to contribute to her history.  Labs are significant for a urinary tract infection.  We will dose IV Rocephin and continue to closely monitor.  CT negative.  Urinalysis consistent with urinary tract infection with white blood cell clumps.  Patient remains extremely somnolent but does awaken to voice or light physical stimuli.  Falls back asleep shortly after awakening.  Given her altered mental status with increased somnolence and urinary tract infection we will admit to the hospital for continued IV antibiotics.  ____________________________________________   FINAL CLINICAL IMPRESSION(S) / ED DIAGNOSES  Altered mental status Urinary tract infection    Minna AntisPaduchowski, Quetzali Heinle, MD 01/13/17 1041

## 2017-01-13 NOTE — Clinical Social Work Note (Addendum)
Clinical Social Work Assessment  Patient Details  Name: Alejandra Armstrong MRN: [id number] Date of Birth: 03/28/1929  Date of referral:  01/13/17               Reason for consult:  Other (Comment Required) (From [facility] )                Permission sought to share information with:  Facility Industrial/product designerContact Representative Permission granted to share information::  Yes, Verbal Permission Granted  Name::      [facility]   Agency::     Relationship::     Contact Information:     Housing/Transportation Living arrangements for the past 2 months:  Assisted Living Facility Source of Information:  Adult Children, Facility Patient Interpreter Needed:  None Criminal Activity/Legal Involvement Pertinent to Current Situation/Hospitalization:  No - Comment as needed Significant Relationships:  Adult Children Lives with:  Facility Resident Do you feel safe going back to the place where you live?    Need for family participation in patient care:  Yes (Comment)  Care giving concerns:  [facility] located at [address], [place] (fax: [phone]).     Social Worker assessment / plan:  Visual merchandiserClinical Social Worker (CSW) reviewed chart and noted that [facility]. Per chart patient is not alert and oriented X4, so CSW contacted her daughter Alejandra Armstrong. Per daughter patient has been a [facility] since Jan[facility], [facility]. Per daughter she is patient's HPOA and lives in [place]. Per daughter patient has dementia and is more confused now then normally. Per daughter patient can speak english however when she is confused patient can only understand and speak spanish. Daughter reported that patient walks without an assistive device at baseline. Daughter is agreeable for patient to return to [facility]. [facility] and made her aware of above. Per Alejandra NixonJanice patient is in  the memory care unit and can return when stable. Per Alejandra NixonJanice patient is on room air at baseline and walks without an assistive device. Per Alejandra NixonJanice if patient needs home health they prefer [facility]. CSW will continue to follow and assist as needed.   Employment status:  Retired Health and safety inspectornsurance information:  Medicare PT Recommendations:  Not assessed at this time Information / Referral to community resources:  Other (Comment Required) (Patient will return to ALF )  Patient/Family's Response to care:  Patient's daughter is agreeable for patient to return to [facility].    Patient/Family's Understanding of and Emotional Response to Diagnosis, Current Treatment, and Prognosis:  Patient's daughter was very pleasant and thanked CSW for assistance.   Emotional Assessment Appearance:  Appears stated age Attitude/Demeanor/Rapport:  Unable to Assess Affect (typically observed):  Unable to Assess Orientation:  Oriented to Self, Fluctuating Orientation (Suspected and/or reported Sundowners) Alcohol / Substance use:  Not Applicable Psych involvement (Current and /or in the community):  No (Comment)  Discharge Needs  Concerns to be addressed:  Discharge Planning Concerns Readmission within the last 30 days:  No Current discharge risk:  Cognitively Impaired Barriers to Discharge:  Continued Medical Work up   [employer], Alejandra CrockerBailey M, LCSW 01/13/2017, 4:02 PM

## 2017-01-13 NOTE — Progress Notes (Signed)
[facility] but realized patient is too confused to be admitted to current room.  Plan to admit to 159 after room is cleaned.

## 2017-01-13 NOTE — ED Notes (Signed)
Patient is resting comfortably. 

## 2017-01-13 NOTE — ED Notes (Addendum)
This RN to bedside at this time. Pt noted to have removed her clothes and her IV. Pt placed back in gown, VS obtained by this RN. Pt covered up with a blanket. Pt is noted to be mumbling under her breath in Spanish, does not actively participate in care with this RN when this RN speaks to patient. Pt noted to be pulling at heart monitor at this time.

## 2017-01-13 NOTE — ED Triage Notes (Signed)
Pt presents via EMS after falling from a seated position in the dining room of the facility where she lives. Pt is unable to participate in health care, unable to provide health history or confirm it. Pt is reported to normally interact w/ caregivers, regardless of understanding. Pt is non-interactive at the time of triage. Unable to establish if pt is having pain. Report from NH staff state pt has AMS after fall where she struck the R side of her head.

## 2017-01-13 NOTE — Care Management (Signed)
Patient from [facility]. Alejandra NixonJanice with [facility] called and would like to speak with CSW. CSW updated.

## 2017-01-13 NOTE — NC FL2 (Signed)
[place] MEDICAID FL2 LEVEL OF CARE SCREENING TOOL     IDENTIFICATION  Patient Name: Alejandra Armstrong Birthdate: 1930/01/06 Sex: female Admission Date (Current Location): 01/13/2017  [place] Number:  Chiropodist and Address:  [facility], [address] [place], [place] [address]      Provider Number: [id number]  Attending Physician Name and Address:  Houston Siren, MD  Relative Name and Phone Number:       Current Level of Care: Hospital Recommended Level of Care: ALF Memory Care  Prior Approval Number:    Date Approved/Denied:   PASRR Number: [id number]  Discharge Plan: ALF: Memory Care      Current Diagnoses: Primary: Dementia Patient Active Problem List   Diagnosis Date Noted  . Altered mental status 01/13/2017  . Late onset Alzheimer's disease without behavioral disturbance 04/03/2016  . Hypothyroidism 04/03/2016  . Mixed obsessional thoughts and acts 04/03/2016  . Arthritis 02/28/2016    Orientation RESPIRATION BLADDER Height & Weight     Self  Room Air  Incontinent Weight: 114 lb 12.8 oz (52.1 kg) Height:  5' (152.4 cm)  BEHAVIORAL SYMPTOMS/MOOD NEUROLOGICAL BOWEL NUTRITION STATUS      Continent Regular Diet   AMBULATORY STATUS COMMUNICATION OF NEEDS Skin   Extensive Assist Verbally Normal                       Personal Care Assistance Level of Assistance  Bathing, Feeding, Dressing Bathing Assistance: Limited assistance Feeding assistance: Independent Dressing Assistance: Limited assistance     Functional Limitations Info  Sight, Hearing, Speech Sight Info: Adequate Hearing Info: Adequate Speech Info: Adequate    SPECIAL CARE FACTORS FREQUENCY  PT (By licensed PT)     PT 2-3 home health               Contractures      Additional Factors Info  Code Status, Allergies Code Status Info:  (Full Code. ) Allergies Info:  (Venlafaxine Hcl)           Current Medications  (01/13/2017):  This is the current hospital active medication list Current Facility-Administered Medications  Medication Dose Route Frequency Provider Last Rate Last Dose  . 0.9 %  sodium chloride infusion   Intravenous Continuous Houston Siren, MD 75 mL/hr at 01/13/17 1428    . acetaminophen (TYLENOL) tablet 650 mg  650 mg Oral Q6H PRN Houston Siren, MD       Or  . acetaminophen (TYLENOL) suppository 650 mg  650 mg Rectal Q6H PRN Houston Siren, MD      . cefTRIAXone (ROCEPHIN) 1 g in dextrose 5 % 50 mL IVPB - Premix  1 g Intravenous Q24H Sainani, Rolly Pancake, MD      . cholecalciferol (VITAMIN D) tablet 2,000 Units  2,000 Units Oral Daily Sainani, Rolly Pancake, MD      . levothyroxine (SYNTHROID, LEVOTHROID) tablet 100 mcg  100 mcg Oral QAC breakfast Houston Siren, MD      . memantine [facility]) tablet 10 mg  10 mg Oral BID Houston Siren, MD      . ondansetron (ZOFRAN) tablet 4 mg  4 mg Oral Q6H PRN Houston Siren, MD       Or  . ondansetron (ZOFRAN) injection 4 mg  4 mg Intravenous Q6H PRN Houston Siren, MD      . risperiDONE (RISPERDAL) tablet 0.75 mg  0.75 mg Oral QHS  Houston SirenSainani, Vivek J, MD      . sertraline (ZOLOFT) tablet 75 mg  75 mg Oral Daily Houston SirenSainani, Vivek J, MD      . traZODone (DESYREL) tablet 25 mg  25 mg Oral QHS Houston SirenSainani, Vivek J, MD      . traZODone (DESYREL) tablet 25 mg  25 mg Oral Q6H PRN Houston SirenSainani, Vivek J, MD         Discharge Medications: Please see discharge summary for a list of discharge medications.  Relevant Imaging Results:  Relevant Lab Results:   Additional Information  (SSN: [id number])  Mckenna Gamm, Darleen CrockerBailey M, LCSW

## 2017-01-13 NOTE — ED Notes (Signed)
This RN noted by Orvilla Fusommy, EDT-P that patient removed 2nd IV placed.

## 2017-01-13 NOTE — ED Notes (Signed)
Safety mittens placed on patient's hands at this time to prevent patient from pulling out her IV.

## 2017-01-13 NOTE — ED Notes (Signed)
Adult mittens placed on pt's hands, ensuring they were not too tight.

## 2017-01-13 NOTE — ED Notes (Signed)
ED Provider at bedside. 

## 2017-01-13 NOTE — ED Notes (Signed)
This RN notified by Amy, RN that patient was going to be moved to room 159 for best visual access of patient due to dementia. Stat clean was being placed on room. Maxine GlennMonica, Consulting civil engineerCharge RN notified.

## 2017-01-13 NOTE — ED Notes (Signed)
Pt resting in bed, interpreter used for IV placement and blood draw, pt resting with eyes closed, pt not verbally responding to anything but her name

## 2017-01-14 ENCOUNTER — Inpatient Hospital Stay: Payer: Medicare Other

## 2017-01-14 LAB — CBC
HEMATOCRIT: 39.7 % (ref 35.0–47.0)
Hemoglobin: 12.7 g/dL (ref 12.0–16.0)
MCH: 25.8 pg — AB (ref 26.0–34.0)
MCHC: 32 g/dL (ref 32.0–36.0)
MCV: 80.7 fL (ref 80.0–100.0)
Platelets: 250 10*3/uL (ref 150–440)
RBC: 4.92 MIL/uL (ref 3.80–5.20)
RDW: 15.1 % — AB (ref 11.5–14.5)
WBC: 6.4 10*3/uL (ref 3.6–11.0)

## 2017-01-14 LAB — BASIC METABOLIC PANEL
Anion gap: 10 (ref 5–15)
BUN: 14 mg/dL (ref 6–20)
CHLORIDE: 107 mmol/L (ref 101–111)
CO2: 26 mmol/L (ref 22–32)
Calcium: 9.4 mg/dL (ref 8.9–10.3)
Creatinine, Ser: 0.93 mg/dL (ref 0.44–1.00)
GFR calc Af Amer: >60 mL/min (ref >60)
GFR, EST NON AFRICAN AMERICAN: 54 mL/min — AB (ref >60)
GLUCOSE: 93 mg/dL (ref 65–99)
POTASSIUM: 4 mmol/L (ref 3.5–5.1)
Sodium: 143 mmol/L (ref 135–145)

## 2017-01-14 NOTE — Progress Notes (Signed)
Patient changed from inpatient to observation today. PT is recommending SNF. Clinical Education officer, museum (CSW) contacted [employer] and made her aware of above. Per Thayer Headings she will accept patient back at [facility] memory care with home health PT through [facility]. RN case manager aware of above.   CSW contacted patient's daughter Cornelius Moras and made her aware that medicare will not pay for short term rehab at a SNF. CSW explained private pay option. Per daughter patient can't pay privately for SNF and patient just got approved for special assistance medicaid for an ALF memory care unit. CSW explained that patient can D/C back to [facility] with home health and daughter could follow up with DSS about changing medicaid over to long term care SNF if that is what she needs long term. Daughter verbalized her understanding and is agreeable for patient to return to [facility].   CSW also met with patient's daughter's partner Dominica Severin at bedside at daughter's request and made him aware of above. Plan is for patient to D/C back to [facility] tomorrow pending medical clearance. CSW will continue to follow and assist as needed.   [employer], LCSW [phone]

## 2017-01-14 NOTE — Evaluation (Signed)
Physical Therapy Evaluation Patient Details Name: Alejandra Armstrong MRN: [id number] DOB: 12/14/1929 Today's Date: 01/14/2017   History of Present Illness  Pt is an 83 y.o.femalewith a known history of Alzheimer's dementia, hypothyroidism, osteoarthritis who presents from assisted living due to altered mental status.  Patient presented to the emergency room was noted to have a urinary tract infection based on urinalysis. She is afebrile, with a normal white cell count and hemodynamically stable. Given her mental status change and metabolic encephalopathy hospitalist services were contacted further treatment and evaluation.  Assessment includes: AMS with metabolic encephalopathy secondary to UTI, hypothyroidism, and dementia with behavioral disturbance.      Clinical Impression  Pt presents with deficits in strength, transfers, mobility, gait, balance, and activity tolerance.  Pt able to follow simple commands with assistance from pt's nurse assisting as spanish interpreter.  Pt required assist to get upper body and BLEs out of bed and to assist for proper positioning in sitting.  Pt required +2 HHA for safety with transfers and amb along with min to mod A to prevent LOB.  Pt's functional mobility and gait deficits appear pronounced relative to pt's baseline.  Pt will benefit from PT services in a SNF setting upon discharge to safely address above deficits for decreased caregiver assistance and eventual return to PLOF.      Follow Up Recommendations SNF    Equipment Recommendations  Other (comment) (TBD at next venue of care)    Recommendations for Other Services       Precautions / Restrictions Precautions Precautions: Fall Restrictions Weight Bearing Restrictions: No      Mobility  Bed Mobility Overal bed mobility: Needs Assistance Bed Mobility: Sit to Supine       Sit to supine: Mod assist   General bed mobility comments: Mod assist for upper and lower body out of bed and  into proper positioning  Transfers Overall transfer level: Needs assistance Equipment used: 2 person hand held assist Transfers: Sit to/from Stand Sit to Stand: +2 physical assistance;Mod assist         General transfer comment: Assist required for both standing from EOB and to prevent posterior LOB once in standing  Ambulation/Gait Ambulation/Gait assistance: Min assist Ambulation Distance (Feet): 3 Feet Assistive device: 2 person hand held assist Gait Pattern/deviations: Step-to pattern   Gait velocity interpretation: <1.8 ft/sec, indicative of risk for recurrent falls General Gait Details: Very slow cadence and small B step length with constant min A to prevent posterior LOB  Stairs            Wheelchair Mobility    Modified Rankin (Stroke Patients Only)       Balance Overall balance assessment: Needs assistance Sitting-balance support: Bilateral upper extremity supported;Feet supported Sitting balance-Leahy Scale: Fair     Standing balance support: Bilateral upper extremity supported Standing balance-Leahy Scale: Poor Standing balance comment: Posterior instability in standing                             Pertinent Vitals/Pain Pain Assessment: No/denies pain    Home Living Family/patient expects to be discharged to:: Skilled nursing facility                      Prior Function Level of Independence: Needs assistance   Gait / Transfers Assistance Needed: Per chart review family reported pt able to amb in memory care facility without AD  ADL's / [facility]  Needed: Pt a resident at [facility] memory [facility] unit        Hand Dominance        Extremity/Trunk Assessment   Upper Extremity Assessment Upper Extremity Assessment: Generalized weakness;Difficult to assess due to impaired cognition    Lower Extremity Assessment Lower Extremity Assessment: Generalized weakness;Difficult to assess due to impaired  cognition       Communication   Communication: Interpreter utilized Hotel manager assisted as interpreter)  Cognition Arousal/Alertness: Lethargic Behavior During Therapy: WFL for tasks assessed/performed Overall Cognitive Status: No family/caregiver present to determine baseline cognitive functioning                                        General Comments      Exercises     Assessment/Plan    PT Assessment Patient needs continued PT services  PT Problem List Decreased strength;Decreased activity tolerance;Decreased balance;Decreased mobility       PT Treatment Interventions DME instruction;Gait training;Functional mobility training;Neuromuscular re-education;Balance training;Therapeutic exercise;Therapeutic activities;Patient/family education    PT Goals (Current goals can be found in the Care Plan section)  Acute Rehab PT Goals PT Goal Formulation: Patient unable to participate in goal setting Time For Goal Achievement: 01/27/17 Potential to Achieve Goals: Fair    Frequency Min 2X/week   Barriers to discharge Inaccessible home environment      Co-evaluation               AM-PAC PT "6 Clicks" Daily Activity  Outcome Measure Difficulty turning over in bed (including adjusting bedclothes, sheets and blankets)?: Unable Difficulty moving from lying on back to sitting on the side of the bed? : Unable Difficulty sitting down on and standing up from a chair with arms (e.g., wheelchair, bedside commode, etc,.)?: Unable Help needed moving to and from a bed to chair (including a wheelchair)?: Total Help needed walking in hospital room?: Total Help needed climbing 3-5 steps with a railing? : Total 6 Click Score: 6    End of Session Equipment Utilized During Treatment: Gait belt;Oxygen Activity Tolerance: Patient limited by fatigue Patient left: in chair;with chair alarm set;with nursing/sitter in room;with call bell/phone within reach Nurse  Communication: Mobility status PT Visit Diagnosis: Unsteadiness on feet (R26.81);Difficulty in walking, not elsewhere classified (R26.2);Muscle weakness (generalized) (M62.81)    Time: 0950-1010 PT Time Calculation (min) (ACUTE ONLY): 20 min   Charges:   PT Evaluation $PT Eval Low Complexity: 1 Low     PT G Codes:   PT G-Codes **NOT FOR INPATIENT CLASS** Functional Assessment Tool Used: AM-PAC 6 Clicks Basic Mobility Functional Limitation: Mobility: Walking and moving around Mobility: Walking and Moving Around Current Status (A2130): 100 percent impaired, limited or restricted Mobility: Walking and Moving Around Goal Status (Q6578): At least 40 percent but less than 60 percent impaired, limited or restricted    D. Scott Woodie Trusty PT, DPT 01/14/17, 1:34 PM

## 2017-01-14 NOTE — Progress Notes (Signed)
[facility] [place] [facility]   PATIENT NAME: Alejandra Armstrong    MR#:  [id number]  DATE OF BIRTH:  12/16/1929  SUBJECTIVE:  Patient was brought in from [facility] assisted living with increasing confusion she was found to have UTI.  She is more awake alert eating.  Sitter in the room she appears to be calm  REVIEW OF SYSTEMS:   Review of Systems  Unable to perform ROS: Dementia   Tolerating Diet:yes Tolerating PT: snf  DRUG ALLERGIES:   Allergies  Allergen Reactions  . Venlafaxine Hcl Other (See Comments)    Hallucinations, combative behavior    VITALS:  Blood pressure (!) 136/58, pulse 90, temperature 98.2 F (36.8 C), temperature source Oral, resp. rate 20, height 5' (1.524 m), weight 52.1 kg (114 lb 12.8 oz), SpO2 98 %.  PHYSICAL EXAMINATION:   Physical Exam  GENERAL:  83 y.o.-year-old patient lying in the bed with no acute distress.  EYES: Pupils equal, round, reactive to light and accommodation. No scleral icterus. Extraocular muscles intact.  HEENT: Head atraumatic, normocephalic. Oropharynx and nasopharynx clear.  NECK:  Supple, no jugular venous distention. No thyroid enlargement, no tenderness.  LUNGS: Normal breath sounds bilaterally, no wheezing, rales, rhonchi. No use of accessory muscles of respiration.  CARDIOVASCULAR: S1, S2 normal. No murmurs, rubs, or gallops.  ABDOMEN: Soft, nontender, nondistended. Bowel sounds present. No organomegaly or mass.  EXTREMITIES: No cyanosis, clubbing or edema b/l.    NEUROLOGIC: Cranial nerves II through XII are intact. No focal Motor or sensory deficits b/l.   PSYCHIATRIC:  patient is alert and dementia.  SKIN: No obvious rash, lesion, or ulcer.   LABORATORY PANEL:  CBC  Recent Labs Lab 01/14/17 0452  WBC 6.4  HGB 12.7  HCT 39.7  PLT 250    Chemistries   Recent Labs Lab 01/13/17 0707 01/14/17 0452  NA 139 143  K 5.2* 4.0  CL 106 107  CO2 23 26  GLUCOSE 122* 93  BUN 12 14   CREATININE 1.07* 0.93  CALCIUM 9.5 9.4  AST 29  --   ALT 9*  --   ALKPHOS 80  --   BILITOT 1.2  --    Cardiac Enzymes No results for input(s): TROPONINI in the last 168 hours. RADIOLOGY:  Ct Head Wo Contrast  Result Date: 01/13/2017 CLINICAL DATA:  Altered mental status EXAM: CT HEAD WITHOUT CONTRAST TECHNIQUE: Contiguous axial images were obtained from the base of the skull through the vertex without intravenous contrast. COMPARISON:  05/06/2016 FINDINGS: Brain: There is atrophy and chronic small vessel disease changes. No acute intracranial abnormality. Specifically, no hemorrhage, hydrocephalus, mass lesion, acute infarction, or significant intracranial injury. Vascular: No hyperdense vessel or unexpected calcification. Skull: No acute calvarial abnormality. Sinuses/Orbits: Visualized paranasal sinuses and mastoids clear. Orbital soft tissues unremarkable. Other: None IMPRESSION: No acute intracranial abnormality. Atrophy, chronic microvascular disease. Electronically Signed   By: Charlett NoseKevin  Dover M.D.   On: 01/13/2017 08:12   ASSESSMENT AND PLAN:  83 year old female with past medical history of Alzheimer's dementia, hypothyroidism, osteoarthritis who presents to the hospital due to altered mental status.  1. Altered mental status-metabolic encephalopathy secondary to urinary tract infection. Patient CT head was negative for acute pathology.  -We'll treat the patient with IV fluids, IV ceftriaxone for UTI and follow mental status. -pt is currently on RA. CXr today  2. Urinary tract infection-based on urinalysis. We'll treat the patient with IV ceftriaxone  3. Hypothyroidism-continue Synthroid.  4. Dementia  with behavioral disturbance-continue Namenda, Risperdal, Zoloft.  Pt recommends SNf D/w dter Isabelle Course on the phone  Case discussed with Care Management/Social Worker. Management plans discussed with the patient, family and they are in agreement.  CODE STATUS: Full  DVT  Prophylaxis: lovenox TOTAL TIME TAKING CARE OF THIS PATIENT: *25* minutes.  >50% time spent on counselling and coordination of care  POSSIBLE D/C IN *1-2 DAYS, DEPENDING ON CLINICAL CONDITION.  Note: This dictation was prepared with Dragon dictation along with smaller phrase technology. Any transcriptional errors that result from this process are unintentional.  Nihaal Friesen M.D on 01/14/2017 at 2:12 PM  Between 7am to 6pm - Pager - [phone]  After 6pm go to [email or web address] - Social research officer, government  [facility]  Office  [phone]  CC: Primary care physician; Maple Hudson., MD

## 2017-01-14 NOTE — Plan of Care (Signed)
Problem: Respiratory: Goal: Ability to maintain adequate ventilation will improve Outcome: Progressing Clear lung sounds on auscultation. Patient now on room air Sats 97%.

## 2017-01-14 NOTE — Care Management CC44 (Signed)
Condition Code 44 Documentation Completed  Patient Details  Name: Alejandra Armstrong MRN: [id number] Date of Birth: 06/21/1929   Condition Code 44 given:  Yes Patient signature on Condition Code 44 notice:  Yes Documentation of 2 MD's agreement:  Yes Code 44 added to claim:  Yes    Marily MemosLisa M Taunja Brickner, RN 01/14/2017, 2:51 PM

## 2017-01-14 NOTE — Care Management (Signed)
Rolling walker given

## 2017-01-14 NOTE — Care Management Note (Signed)
Case Management Note  Patient Details  Name: Alejandra Armstrong MRN: [id number] Date of Birth: 05-29-1929  Subjective/Objective:   Spoke with daughter and reviewed observation letter. PT recommending SNF. Patient is not eligible under medicare observation. Daughter verbalized understanding. Will set up PT at facility through [facility]. Daughter agreeable to POC. Referral to [facility]. It is anticipated patient will discharge tomorrow.    Met with daughter outside of room. Reviewed observation status and POC.She is very thankful  For the assistance for her mother.               Action/Plan:   Expected Discharge Date:                  Expected Discharge Plan:  [place]  In-House Referral:  Clinical Social Work  Discharge planning Services  CM Consult  Post Acute Care Choice:  Home Health Choice offered to:  Adult Children  DME Arranged:    DME Agency:     HH Arranged:  PT HH Agency:  [facility]  Status of Service:  In process, will continue to follow  If discussed at Long Length of Stay Meetings, dates discussed:    Additional Comments:  Jolly Mango, RN 01/14/2017, 3:02 PM

## 2017-01-14 NOTE — Care Management Obs Status (Signed)
MEDICARE OBSERVATION STATUS NOTIFICATION   Patient Details  Name: Alejandra Armstrong MRN: [id number] Date of Birth: 08/15/1929   Medicare Observation Status Notification Given:  Yes    Marily MemosLisa M Olevia Westervelt, RN 01/14/2017, 2:51 PM

## 2017-01-15 ENCOUNTER — Telehealth: Payer: Self-pay | Admitting: Family Medicine

## 2017-01-15 MED ORDER — CEPHALEXIN 500 MG PO CAPS
500.0000 mg | ORAL_CAPSULE | Freq: Two times a day (BID) | ORAL | Status: DC
  Administered 2017-01-15: 500 mg via ORAL
  Filled 2017-01-15: qty 1

## 2017-01-15 MED ORDER — CEPHALEXIN 500 MG PO CAPS: 500.0000 mg | ORAL_CAPSULE | Freq: Two times a day (BID) | ORAL | 0 refills | Status: DC

## 2017-01-15 NOTE — Telephone Encounter (Signed)
[facility] called to schedule hospital F/U for pt. Pt is being discharged today 01/15/17 and is scheduled for f/u on 01/30/17. Please advise. Thanks TNP

## 2017-01-15 NOTE — Care Management Note (Signed)
Case Management Note  Patient Details  Name: [name] MRN: [id number] Date of Birth: 08/24/1929  Subjective/Objective: Discharging today                   Action/Plan: [facility] notified of discharge. HHPT.    Expected Discharge Date:  01/15/17               Expected Discharge Plan:  Home w Home Health Services  In-House Referral:  Clinical Social Work  Discharge planning Services  CM Consult  Post Acute Care Choice:  Home Health Choice offered to:  Adult Children  DME Arranged:  Walker rolling DME Agency:  [facility]  HH Arranged:  PT HH Agency:  [facility]  Status of Service:  Completed, signed off  If discussed at Long Length of Stay Meetings, dates discussed:    Additional Comments:  Marily MemosLisa M Willie Plain, RN 01/15/2017, 8:48 AM

## 2017-01-15 NOTE — Progress Notes (Signed)
Patient is medically stable for D/C back to [facility] memory care unit today with home health PT. [facility] [name] patient can return today via EMS. Clinical Child psychotherapistocial Worker (CSW) [facility] RN will call report and arrange EMS for transport. CSW contacted patient's daughter [name] and made her aware of above. Please reconsult if future social work needs arise. CSW signing off.   Baker Hughes IncorporatedBailey Kamia Insalaco, LCSW [phone]

## 2017-01-15 NOTE — Telephone Encounter (Signed)
See below-Alejandra Armstrong Alejandra Armstrong, RMA  

## 2017-01-15 NOTE — [place] FL2 (Signed)
[place] MEDICAID FL2 LEVEL OF CARE SCREENING TOOL     IDENTIFICATION  Patient Name: Alejandra Armstrong Birthdate: 01/22/1930 Sex: female Admission Date (Current Location): 01/13/2017  [place]   Facility and Address:  [facility], [address], [place], [place] [address]      Provider Number: [id number]  Attending Physician Name and Address:  Enedina FinnerPatel, Sona, MD  Relative Name and Phone Number:       Current Level of Care: Hospital Recommended Level of Care: Assisted Living Facility, Memory Care Prior Approval Number:    Date Approved/Denied:   PASRR Number:  ([id number] )  Discharge Plan: Domiciliary (Rest home) (Memory Care )    Current Diagnoses: Primary: Alzheimer's Dementia Patient Active Problem List   Diagnosis Date Noted  . Altered mental status 01/13/2017  . Late onset Alzheimer's disease without behavioral disturbance 04/03/2016  . Hypothyroidism 04/03/2016  . Mixed obsessional thoughts and acts 04/03/2016  . Arthritis 02/28/2016    Orientation RESPIRATION BLADDER Height & Weight     Self  Room Air  Incontinent Weight: 114 lb 12.8 oz (52.1 kg) Height:  5' (152.4 cm)  BEHAVIORAL SYMPTOMS/MOOD NEUROLOGICAL BOWEL NUTRITION STATUS      Continent Regular Diet   AMBULATORY STATUS COMMUNICATION OF NEEDS Skin   Limited Assist Verbally Normal                       Personal Care Assistance Level of Assistance  Bathing, Feeding, Dressing Bathing Assistance: Limited assistance Feeding assistance: Independent Dressing Assistance: Limited assistance     Functional Limitations Info  Sight, Hearing, Speech Sight Info: Adequate Hearing Info: Adequate Speech Info: Adequate    SPECIAL CARE FACTORS FREQUENCY  PT (By licensed PT)     PT Frequency:  (2-3 home health )              Contractures      Additional Factors Info  Code Status, Allergies Code Status Info:  (Full Code. ) Allergies  Info:  (Venlafaxine Hcl)          Discharge Medications: Please see discharge summary for a list of discharge medications. Current Discharge Medication List        START taking these medications   Details  cephALEXin (KEFLEX) 500 MG capsule Take 1 capsule (500 mg total) by mouth every 12 (twelve) hours. Qty: 12 capsule, Refills: 0          CONTINUE these medications which have NOT CHANGED   Details  Cholecalciferol (VITAMIN D3) 2000 units TABS Take 2,000 Units by mouth daily.    !! levothyroxine (SYNTHROID, LEVOTHROID) 100 MCG tablet Take 100 mcg by mouth daily before breakfast.    memantine (NAMENDA) 10 MG tablet Take 10 mg by mouth 2 (two) times daily.    risperiDONE (RISPERDAL) 0.5 MG tablet Take 0.75 mg by mouth at bedtime.    sertraline (ZOLOFT) 50 MG tablet Take 75 mg by mouth daily.    traZODone (DESYREL) 50 MG tablet Take 25 mg by mouth at bedtime.    galantamine (RAZADYNE) 8 MG tablet Take 1 tablet (8 mg total) by mouth daily. Qty: 30 tablet, Refills: 5    !! levothyroxine (SYNTHROID, LEVOTHROID) 25 MCG tablet Take 1 tablet (25 mcg total) by mouth daily before breakfast. Qty: 30 tablet, Refills: 12   Associated Diagnoses: Hypothyroidism, unspecified type    Relevant Imaging Results: Relevant Lab Results: Additional Information  (SSN: [id number])  Charnese Federici, Fredric MareBailey  M, LCSW

## 2017-01-15 NOTE — Telephone Encounter (Signed)
Ok

## 2017-01-15 NOTE — Discharge Summary (Signed)
[facility] -  at [facility]   PATIENT NAME: Alejandra Armstrong    MR#:  [id number]  DATE OF BIRTH:  09-21-1929  DATE OF ADMISSION:  01/13/2017 ADMITTING PHYSICIAN: Houston Siren, MD  DATE OF DISCHARGE: 01/15/2017  PRIMARY CARE PHYSICIAN: Maple Hudson., MD    ADMISSION DIAGNOSIS:  Lower urinary tract infectious disease [N39.0] Altered mental status, unspecified altered mental status type [R41.82]  DISCHARGE DIAGNOSIS:  AMS with h/o advance dementia UTI Mild dehydration  SECONDARY DIAGNOSIS:   Past Medical History:  Diagnosis Date  . Alzheimer's dementia   . Arthritis   . Thyroid disease    hypothyroidism    HOSPITAL COURSE:   83 year old female with past medical history of Alzheimer's dementia, hypothyroidism, osteoarthritis who presents to the hospital due to altered mental status.  1. Altered mental status-metabolic encephalopathy secondary to urinary tract infection. Patient CT head was negative for acute pathology.  -We'll treat the patient with IV fluids, IV ceftriaxone for UTI  -pt is currently on RA. CXr negative  2. Urinary tract infection-based on urinalysis. We'll treat the patient with IV ceftriaxone--change to oral keflex  3. Hypothyroidism-continue Synthroid.  4. Dementia with behavioral disturbance-continue Namenda, Risperdal, Zoloft.  Pt recommends SNf--pt will return to [facility] today with HHPT. She is under observation and family cannot afford private pay for her to be at SNF D/w dter Isabelle Course  In the room CONSULTS OBTAINED:    DRUG ALLERGIES:   Allergies  Allergen Reactions  . Venlafaxine Hcl Other (See Comments)    Hallucinations, combative behavior    DISCHARGE MEDICATIONS:   Current Discharge Medication List    START taking these medications   Details  cephALEXin (KEFLEX) 500 MG capsule Take 1 capsule (500 mg total) by mouth every 12 (twelve) hours. Qty: 12 capsule, Refills: 0       CONTINUE these medications which have NOT CHANGED   Details  Cholecalciferol (VITAMIN D3) 2000 units TABS Take 2,000 Units by mouth daily.    !! levothyroxine (SYNTHROID, LEVOTHROID) 100 MCG tablet Take 100 mcg by mouth daily before breakfast.    memantine (NAMENDA) 10 MG tablet Take 10 mg by mouth 2 (two) times daily.    risperiDONE (RISPERDAL) 0.5 MG tablet Take 0.75 mg by mouth at bedtime.    sertraline (ZOLOFT) 50 MG tablet Take 75 mg by mouth daily.    traZODone (DESYREL) 50 MG tablet Take 25 mg by mouth at bedtime.    galantamine (RAZADYNE) 8 MG tablet Take 1 tablet (8 mg total) by mouth daily. Qty: 30 tablet, Refills: 5    !! levothyroxine (SYNTHROID, LEVOTHROID) 25 MCG tablet Take 1 tablet (25 mcg total) by mouth daily before breakfast. Qty: 30 tablet, Refills: 12   Associated Diagnoses: Hypothyroidism, unspecified type     !! - Potential duplicate medications found. Please discuss with provider.      If you experience worsening of your admission symptoms, develop shortness of breath, life threatening emergency, suicidal or homicidal thoughts you must seek medical attention immediately by calling 911 or calling your MD immediately  if symptoms less severe.  You Must read complete instructions/literature along with all the possible adverse reactions/side effects for all the Medicines you take and that have been prescribed to you. Take any new Medicines after you have completely understood and accept all the possible adverse reactions/side effects.   Please note  You were cared for by a hospitalist during your hospital stay. If you have any  questions about your discharge medications or the care you received while you were in the hospital after you are discharged, you can call the unit and asked to speak with the hospitalist on call if the hospitalist that took care of you is not available. Once you are discharged, your primary care physician will handle any further medical  issues. Please note that NO REFILLS for any discharge medications will be authorized once you are discharged, as it is imperative that you return to your primary care physician (or establish a relationship with a primary care physician if you do not have one) for your aftercare needs so that they can reassess your need for medications and monitor your lab values. Today   SUBJECTIVE   Sleeping dter in the room  VITAL SIGNS:  Blood pressure (!) 126/47, pulse 72, temperature (!) 97.5 F (36.4 C), temperature source Oral, resp. rate 20, height 5' (1.524 m), weight 52.1 kg (114 lb 12.8 oz), SpO2 98 %.  I/O:   Intake/Output Summary (Last 24 hours) at 01/15/17 0833 Last data filed at 01/14/17 1700  Gross per 24 hour  Intake              420 ml  Output              250 ml  Net              170 ml    PHYSICAL EXAMINATION:  GENERAL:  83 y.o.-year-old patient lying in the bed with no acute distress.  EYES: Pupils equal, round, reactive to light and accommodation. No scleral icterus. Extraocular muscles intact.  HEENT: Head atraumatic, normocephalic. Oropharynx and nasopharynx clear.  NECK:  Supple, no jugular venous distention. No thyroid enlargement, no tenderness.  LUNGS: Normal breath sounds bilaterally, no wheezing, rales,rhonchi or crepitation. No use of accessory muscles of respiration.  CARDIOVASCULAR: S1, S2 normal. No murmurs, rubs, or gallops.  ABDOMEN: Soft, non-tender, non-distended. Bowel sounds present. No organomegaly or mass.  EXTREMITIES: No pedal edema, cyanosis, or clubbing.  NEUROLOGIC:dementia PSYCHIATRIC: sleeping.  SKIN: No obvious rash, lesion, or ulcer.   DATA REVIEW:   CBC   Recent Labs Lab 01/14/17 0452  WBC 6.4  HGB 12.7  HCT 39.7  PLT 250    Chemistries   Recent Labs Lab 01/13/17 0707 01/14/17 0452  NA 139 143  K 5.2* 4.0  CL 106 107  CO2 23 26  GLUCOSE 122* 93  BUN 12 14  CREATININE 1.07* 0.93  CALCIUM 9.5 9.4  AST 29  --   ALT 9*  --    ALKPHOS 80  --   BILITOT 1.2  --     Microbiology Results   Recent Results (from the past 240 hour(s))  MRSA PCR Screening     Status: None   Collection Time: 01/13/17  2:00 PM  Result Value Ref Range Status   MRSA by PCR NEGATIVE NEGATIVE Final    Comment:        The GeneXpert MRSA Assay (FDA approved for NASAL specimens only), is one component of a comprehensive MRSA colonization surveillance program. It is not intended to diagnose MRSA infection nor to guide or monitor treatment for MRSA infections.     RADIOLOGY:  Dg Chest Port 1 View  Result Date: 01/14/2017 CLINICAL DATA:  Short of breath, dementia, UTI EXAM: PORTABLE CHEST 1 VIEW COMPARISON:  Chest x-ray of 01/03/2017 FINDINGS: No active infiltrate or effusion is seen. Mediastinal and hilar contours are unremarkable. The heart is  within normal limits in size for age. No bony abnormality is seen. The bones appear somewhat osteopenic. IMPRESSION: No active disease. Electronically Signed   By: Dwyane DeePaul  Barry M.D.   On: 01/14/2017 15:10     Management plans discussed with the patient, family and they are in agreement.  CODE STATUS:     Code Status Orders        Start     Ordered   01/13/17 1347  Full code  Continuous     01/13/17 1346    Code Status History    Date Active Date Inactive Code Status Order ID Comments User Context   This patient has a current code status but no historical code status.      TOTAL TIME TAKING CARE OF THIS PATIENT: *40* minutes.    Lenyx Boody M.D on 01/15/2017 at 8:33 AM  Between 7am to 6pm - Pager - [phone] After 6pm go to [email or web address] - Social research officer, governmentpassword [email or web address]  [facility]  Office  [phone]  CC: Primary care physician; Maple HudsonGilbert, Richard L Jr., MD

## 2017-01-16 NOTE — Progress Notes (Signed)
[facility]  EMS called for transportation.

## 2017-01-30 ENCOUNTER — Inpatient Hospital Stay: Payer: Medicare (Managed Care) | Admitting: Family Medicine

## 2017-02-01 ENCOUNTER — Emergency Department: Payer: Medicare Other

## 2017-02-01 ENCOUNTER — Encounter: Payer: Self-pay | Admitting: Emergency Medicine

## 2017-02-01 ENCOUNTER — Emergency Department
Admission: EM | Admit: 2017-02-01 | Discharge: 2017-02-01 | Disposition: A | Discharge status: Home / Self Care | Payer: Medicare Other | Attending: Emergency Medicine | Admitting: Emergency Medicine

## 2017-02-01 DIAGNOSIS — E039 Hypothyroidism, unspecified: Secondary | ICD-10-CM | POA: Diagnosis not present

## 2017-02-01 DIAGNOSIS — Y999 Unspecified external cause status: Secondary | ICD-10-CM | POA: Insufficient documentation

## 2017-02-01 DIAGNOSIS — Y939 Activity, unspecified: Secondary | ICD-10-CM | POA: Insufficient documentation

## 2017-02-01 DIAGNOSIS — G301 Alzheimer's disease with late onset: Secondary | ICD-10-CM | POA: Insufficient documentation

## 2017-02-01 DIAGNOSIS — Y92129 Unspecified place in nursing home as the place of occurrence of the external cause: Secondary | ICD-10-CM | POA: Insufficient documentation

## 2017-02-01 DIAGNOSIS — W19XXXA Unspecified fall, initial encounter: Secondary | ICD-10-CM

## 2017-02-01 DIAGNOSIS — Z79899 Other long term (current) drug therapy: Secondary | ICD-10-CM | POA: Insufficient documentation

## 2017-02-01 DIAGNOSIS — W01198A Fall on same level from slipping, tripping and stumbling with subsequent striking against other object, initial encounter: Secondary | ICD-10-CM | POA: Diagnosis not present

## 2017-02-01 DIAGNOSIS — S0990XA Unspecified injury of head, initial encounter: Secondary | ICD-10-CM

## 2017-02-01 DIAGNOSIS — S098XXA Other specified injuries of head, initial encounter: Secondary | ICD-10-CM | POA: Insufficient documentation

## 2017-02-01 NOTE — Progress Notes (Signed)
LCSW consulted with ED and  patient will not be admitted at this time. [facility]. No further needs at this time   Delta Air LinesClaudine Jeyli Zwicker LCSW [phone]

## 2017-02-01 NOTE — ED Provider Notes (Addendum)
[facility] Emergency Department Provider Note  Time seen: 11:31 AM  I have reviewed the triage vital signs and the nursing notes.   HISTORY  Chief Complaint No chief complaint on file.    HPI Leda GauzeLuz Maria Dewilde is a 83 y.o. female with a past medical history of Alzheimer's, arthritis, presents to the emergency department after a fall.  According to EMS nursing home staff state they witnessed the patient and fall backwards hitting her head on the wall.  She was attempting to get back up when I told her to stay on the ground until EMS got there.  EMS states the patient appears well, does not speak English, but was in no distress.  Upon arrival patient is calm, cooperative, no distress.  Past Medical History:  Diagnosis Date  . Alzheimer's dementia   . Arthritis   . Thyroid disease    hypothyroidism    Patient Active Problem List   Diagnosis Date Noted  . Altered mental status 01/13/2017  . Late onset Alzheimer's disease without behavioral disturbance 04/03/2016  . Hypothyroidism 04/03/2016  . Mixed obsessional thoughts and acts 04/03/2016  . Arthritis 02/28/2016    Past Surgical History:  Procedure Laterality Date  . ABDOMINAL HYSTERECTOMY      Prior to Admission medications   Medication Sig Start Date End Date Taking? Authorizing Provider  cephALEXin (KEFLEX) 500 MG capsule Take 1 capsule (500 mg total) by mouth every 12 (twelve) hours. 01/15/17   Enedina FinnerPatel, Sona, MD  Cholecalciferol (VITAMIN D3) 2000 units TABS Take 2,000 Units by mouth daily.    [provider]  galantamine (RAZADYNE) 8 MG tablet Take 1 tablet (8 mg total) by mouth daily. Patient not taking: Reported on 03/11/2016 02/28/16   Maple HudsonGilbert, Richard L Jr., MD  levothyroxine (SYNTHROID, LEVOTHROID) 100 MCG tablet Take 100 mcg by mouth daily before breakfast.    [provider]  levothyroxine (SYNTHROID, LEVOTHROID) 25 MCG tablet Take 1 tablet (25 mcg total) by mouth daily  before breakfast. Patient not taking: Reported on 03/11/2016 03/01/16   Maple HudsonGilbert, Richard L Jr., MD  memantine (NAMENDA) 10 MG tablet Take 10 mg by mouth 2 (two) times daily.    [provider]  risperiDONE (RISPERDAL) 0.5 MG tablet Take 0.75 mg by mouth at bedtime.    [provider]  sertraline (ZOLOFT) 50 MG tablet Take 75 mg by mouth daily.    [provider]  traZODone (DESYREL) 50 MG tablet Take 25 mg by mouth at bedtime.    [provider]    Allergies  Allergen Reactions  . Venlafaxine Hcl Other (See Comments)    Hallucinations, combative behavior    Family History  Problem Relation Age of Onset  . Dementia Mother   . Depression Father   . Dementia Sister   . Colon cancer Sister     Social History Social History   Tobacco Use  . Smoking status: Never Smoker  . Smokeless tobacco: Never Used  Substance Use Topics  . Alcohol use: No  . Drug use: No    Review of Systems Unable to obtain an adequate/accurate review of systems due to baseline dementia  ____________________________________________   PHYSICAL EXAM:  Constitutional: Alert, awake, no distress, lying in bed comfortably. Eyes: Normal exam ENT   Head: Normocephalic and atraumatic   Mouth/Throat: Mucous membranes are moist. Cardiovascular: Normal rate, regular rhythm. Respiratory: Normal respiratory effort without tachypnea nor retractions. Breath sounds are clear  Gastrointestinal: Soft and nontender. No distention.  Musculoskeletal: Nontender with normal range of motion in all extremities.  Neurologic: No gross deficits on exam.  Appears to move all extremities. Skin:  Skin is warm, dry and intact.  Psychiatric: Mood and affect are normal.  Acting appropriate for situation.  ____________________________________________  RADIOLOGY  CT scans are negative  ____________________________________________   INITIAL IMPRESSION / ASSESSMENT AND PLAN / ED  COURSE  Pertinent labs & imaging results that were available during my care of the patient were reviewed by me and considered in my medical decision making (see chart for details).  Patient presents to the emergency department after a fall.  Differential would include mechanical fall, closed head injury, contusions, fractures.  On exam patient appears extremely well, has no CT or L-spine tenderness, great range of motion in all extremities without tenderness or pain elicited.  Patient does point to an area on the occipital scalp however there is no hematoma abrasion or laceration noted.  Staff reports that patient did hit her occipital scalp on the wall.  We will obtain a CT scan of the head and neck continue to closely monitor the patient.  Patient is Spanish-speaking will have an interpreter present for evaluation.  CT scans are negative.  Discussed results with Spanish interpreter.  Patient has baseline dementia it is not clear if she understands why she is here, but she is appreciative of the negative CTs.  I have reviewed the patient's records including prior ER visits including her last visit for a fall, has a history of Alzheimer's.   ____________________________________________   FINAL CLINICAL IMPRESSION(S) / ED DIAGNOSES  Head injury Thresa RossFall      Kyel Purk, MD 02/01/17 1330    Minna AntisPaduchowski, Damarcus Reggio, MD 02/01/17 1332

## 2017-02-01 NOTE — ED Triage Notes (Signed)
Patient presents from [facility] via [facility]. Staff reported patient sustained a mechanical fall, falling backward and striking the posterior side of her head. Staff stated that the patient immediately attempted to stand up. Staff denied LOC to EMS

## 2017-02-01 NOTE — ED Notes (Signed)
Patient has small hematoma to posterior head

## 2017-03-03 ENCOUNTER — Inpatient Hospital Stay
Admission: EM | Admit: 2017-03-03 | Discharge: 2017-03-06 | DRG: 470 | Disposition: A | Discharge status: Hospice | Payer: Medicare Other | Attending: Internal Medicine | Admitting: Internal Medicine

## 2017-03-03 ENCOUNTER — Emergency Department: Payer: Medicare Other

## 2017-03-03 ENCOUNTER — Other Ambulatory Visit: Payer: Self-pay

## 2017-03-03 ENCOUNTER — Encounter: Payer: Self-pay | Admitting: Emergency Medicine

## 2017-03-03 DIAGNOSIS — G301 Alzheimer's disease with late onset: Secondary | ICD-10-CM | POA: Diagnosis present

## 2017-03-03 DIAGNOSIS — R000 Tachycardia, unspecified: Secondary | ICD-10-CM | POA: Diagnosis not present

## 2017-03-03 DIAGNOSIS — D638 Anemia in other chronic diseases classified elsewhere: Secondary | ICD-10-CM | POA: Diagnosis present

## 2017-03-03 DIAGNOSIS — Z79899 Other long term (current) drug therapy: Secondary | ICD-10-CM | POA: Diagnosis not present

## 2017-03-03 DIAGNOSIS — S72012A Unspecified intracapsular fracture of left femur, initial encounter for closed fracture: Principal | ICD-10-CM | POA: Diagnosis present

## 2017-03-03 DIAGNOSIS — Y92099 Unspecified place in other non-institutional residence as the place of occurrence of the external cause: Secondary | ICD-10-CM

## 2017-03-03 DIAGNOSIS — M1991 Primary osteoarthritis, unspecified site: Secondary | ICD-10-CM | POA: Diagnosis present

## 2017-03-03 DIAGNOSIS — Z9181 History of falling: Secondary | ICD-10-CM

## 2017-03-03 DIAGNOSIS — Z87440 Personal history of urinary (tract) infections: Secondary | ICD-10-CM

## 2017-03-03 DIAGNOSIS — Z888 Allergy status to other drugs, medicaments and biological substances status: Secondary | ICD-10-CM | POA: Diagnosis not present

## 2017-03-03 DIAGNOSIS — R296 Repeated falls: Secondary | ICD-10-CM | POA: Diagnosis present

## 2017-03-03 DIAGNOSIS — Z818 Family history of other mental and behavioral disorders: Secondary | ICD-10-CM | POA: Diagnosis not present

## 2017-03-03 DIAGNOSIS — S72002A Fracture of unspecified part of neck of left femur, initial encounter for closed fracture: Secondary | ICD-10-CM | POA: Diagnosis not present

## 2017-03-03 DIAGNOSIS — Z90710 Acquired absence of both cervix and uterus: Secondary | ICD-10-CM | POA: Diagnosis not present

## 2017-03-03 DIAGNOSIS — R509 Fever, unspecified: Secondary | ICD-10-CM

## 2017-03-03 DIAGNOSIS — F0280 Dementia in other diseases classified elsewhere without behavioral disturbance: Secondary | ICD-10-CM | POA: Diagnosis present

## 2017-03-03 DIAGNOSIS — Z79890 Hormone replacement therapy (postmenopausal): Secondary | ICD-10-CM | POA: Diagnosis not present

## 2017-03-03 DIAGNOSIS — Z515 Encounter for palliative care: Secondary | ICD-10-CM | POA: Diagnosis present

## 2017-03-03 DIAGNOSIS — M25552 Pain in left hip: Secondary | ICD-10-CM

## 2017-03-03 DIAGNOSIS — Z820 Family history of epilepsy and other diseases of the nervous system: Secondary | ICD-10-CM

## 2017-03-03 DIAGNOSIS — Z7189 Other specified counseling: Secondary | ICD-10-CM | POA: Diagnosis not present

## 2017-03-03 DIAGNOSIS — S72009A Fracture of unspecified part of neck of unspecified femur, initial encounter for closed fracture: Secondary | ICD-10-CM | POA: Diagnosis present

## 2017-03-03 DIAGNOSIS — E039 Hypothyroidism, unspecified: Secondary | ICD-10-CM | POA: Diagnosis present

## 2017-03-03 DIAGNOSIS — I10 Essential (primary) hypertension: Secondary | ICD-10-CM | POA: Diagnosis not present

## 2017-03-03 DIAGNOSIS — W19XXXA Unspecified fall, initial encounter: Secondary | ICD-10-CM | POA: Diagnosis present

## 2017-03-03 DIAGNOSIS — Z66 Do not resuscitate: Secondary | ICD-10-CM | POA: Diagnosis present

## 2017-03-03 LAB — COMPREHENSIVE METABOLIC PANEL
ALBUMIN: 3.5 g/dL (ref 3.5–5.0)
ALT: 25 U/L (ref 14–54)
AST: 37 U/L (ref 15–41)
Alkaline Phosphatase: 88 U/L (ref 38–126)
Anion gap: 10 (ref 5–15)
BILIRUBIN TOTAL: 0.5 mg/dL (ref 0.3–1.2)
BUN: 48 mg/dL — AB (ref 6–20)
CO2: 22 mmol/L (ref 22–32)
CREATININE: 1.03 mg/dL — AB (ref 0.44–1.00)
Calcium: 9.4 mg/dL (ref 8.9–10.3)
Chloride: 103 mmol/L (ref 101–111)
GFR calc Af Amer: 56 mL/min — ABNORMAL LOW (ref >60)
GFR, EST NON AFRICAN AMERICAN: 48 mL/min — AB (ref >60)
GLUCOSE: 210 mg/dL — AB (ref 65–99)
Potassium: 4.5 mmol/L (ref 3.5–5.1)
Sodium: 135 mmol/L (ref 135–145)
TOTAL PROTEIN: 7.6 g/dL (ref 6.5–8.1)

## 2017-03-03 LAB — CBC WITH DIFFERENTIAL/PLATELET
BASOS ABS: 0 10*3/uL (ref 0–0.1)
BASOS PCT: 0 %
Eosinophils Absolute: 0.1 10*3/uL (ref 0–0.7)
Eosinophils Relative: 1 %
HEMATOCRIT: 36.7 % (ref 35.0–47.0)
HEMOGLOBIN: 11.8 g/dL — AB (ref 12.0–16.0)
LYMPHS ABS: 0.6 10*3/uL — AB (ref 1.0–3.6)
LYMPHS PCT: 5 %
MCH: 26.4 pg (ref 26.0–34.0)
MCHC: 32.2 g/dL (ref 32.0–36.0)
MCV: 82 fL (ref 80.0–100.0)
MONO ABS: 0.6 10*3/uL (ref 0.2–0.9)
Monocytes Relative: 5 %
Neutro Abs: 11.3 10*3/uL — ABNORMAL HIGH (ref 1.4–6.5)
Neutrophils Relative %: 89 %
Platelets: 257 10*3/uL (ref 150–440)
RBC: 4.48 MIL/uL (ref 3.80–5.20)
RDW: 15.8 % — ABNORMAL HIGH (ref 11.5–14.5)
WBC: 12.7 10*3/uL — ABNORMAL HIGH (ref 3.6–11.0)

## 2017-03-03 LAB — URINALYSIS, ROUTINE W REFLEX MICROSCOPIC
BILIRUBIN URINE: NEGATIVE
GLUCOSE, UA: NEGATIVE mg/dL
Hgb urine dipstick: NEGATIVE
KETONES UR: NEGATIVE mg/dL
Leukocytes, UA: NEGATIVE
Nitrite: NEGATIVE
PH: 5 (ref 5.0–8.0)
Protein, ur: NEGATIVE mg/dL
SPECIFIC GRAVITY, URINE: 1.024 (ref 1.005–1.030)

## 2017-03-03 LAB — TROPONIN I

## 2017-03-03 MED ORDER — MORPHINE SULFATE (PF) 2 MG/ML IV SOLN: 2.0000 mg | INTRAVENOUS | Status: DC | PRN

## 2017-03-03 MED ORDER — FENTANYL CITRATE (PF) 100 MCG/2ML IJ SOLN
12.5000 ug | INTRAMUSCULAR | Status: DC | PRN
  Administered 2017-03-03 (×2): 12.5 ug via INTRAVENOUS
  Filled 2017-03-03: qty 2

## 2017-03-03 MED ORDER — POLYETHYLENE GLYCOL 3350 17 G PO PACK
17.0000 g | PACK | Freq: Every day | ORAL | Status: DC | PRN
  Administered 2017-03-05: 17 g via ORAL
  Filled 2017-03-03: qty 1

## 2017-03-03 MED ORDER — CALCIUM CARBONATE-VITAMIN D 500-200 MG-UNIT PO TABS
1.0000 | ORAL_TABLET | Freq: Three times a day (TID) | ORAL | Status: DC
  Administered 2017-03-03 – 2017-03-04 (×2): 1 via ORAL
  Filled 2017-03-03 (×3): qty 1

## 2017-03-03 MED ORDER — CEPHALEXIN 500 MG PO CAPS
500.0000 mg | ORAL_CAPSULE | Freq: Two times a day (BID) | ORAL | Status: DC
  Administered 2017-03-03: 500 mg via ORAL
  Filled 2017-03-03: qty 1

## 2017-03-03 MED ORDER — ONDANSETRON HCL 4 MG PO TABS: 4.0000 mg | ORAL_TABLET | Freq: Four times a day (QID) | ORAL | Status: DC | PRN

## 2017-03-03 MED ORDER — SODIUM CHLORIDE 0.9% FLUSH
3.0000 mL | Freq: Two times a day (BID) | INTRAVENOUS | Status: DC
  Administered 2017-03-03: 3 mL via INTRAVENOUS

## 2017-03-03 MED ORDER — ACETAMINOPHEN 325 MG PO TABS
650.0000 mg | ORAL_TABLET | Freq: Four times a day (QID) | ORAL | Status: DC | PRN
  Administered 2017-03-04 – 2017-03-05 (×2): 650 mg via ORAL
  Filled 2017-03-03 (×2): qty 2

## 2017-03-03 MED ORDER — DOCUSATE SODIUM 100 MG PO CAPS
100.0000 mg | ORAL_CAPSULE | Freq: Two times a day (BID) | ORAL | Status: DC
  Administered 2017-03-03 – 2017-03-04 (×2): 100 mg via ORAL
  Filled 2017-03-03 (×3): qty 1

## 2017-03-03 MED ORDER — ALENDRONATE SODIUM 10 MG PO TABS: 70.0000 mg | ORAL_TABLET | ORAL | Status: DC

## 2017-03-03 MED ORDER — ONDANSETRON HCL 4 MG/2ML IJ SOLN: 4.0000 mg | Freq: Four times a day (QID) | INTRAMUSCULAR | Status: DC | PRN

## 2017-03-03 MED ORDER — DEXTROSE IN LACTATED RINGERS 5 % IV SOLN
INTRAVENOUS | Status: DC
  Administered 2017-03-03: 23:00:00 via INTRAVENOUS

## 2017-03-03 MED ORDER — HYDROCODONE-ACETAMINOPHEN 5-325 MG PO TABS
1.0000 | ORAL_TABLET | ORAL | Status: DC | PRN
  Administered 2017-03-04: 1 via ORAL
  Filled 2017-03-03: qty 1

## 2017-03-03 MED ORDER — ACETAMINOPHEN 650 MG RE SUPP: 650.0000 mg | Freq: Four times a day (QID) | RECTAL | Status: DC | PRN

## 2017-03-03 MED ORDER — ADULT MULTIVITAMIN W/MINERALS CH
1.0000 | ORAL_TABLET | Freq: Every day | ORAL | Status: DC
  Filled 2017-03-03: qty 1

## 2017-03-03 NOTE — ED Notes (Signed)
Sena HitchGary Walters contacted by RN to update family that pt is being admitted to room 159.

## 2017-03-03 NOTE — ED Provider Notes (Signed)
[facility] Emergency Department Provider Note    First MD Initiated Contact with Patient 03/03/17 1817     (approximate)  I have reviewed the triage vital signs and the nursing notes.   HISTORY  Chief Complaint Fall and Hip Pain  Level V Caveat:  dementia  HPI Alejandra Armstrong is a 83 y.o. female with a history of Alzheimer's dementia presents after mechanical fall with complaint of left hip pain.  History limited due to patient's underlying dementia.  Reportedly had initial fall 2 days ago and then subsequently had another fall today.  Complaining of moderate to severe pain in the left hip.  No other associated pain or discomfort.  Family unable to be at bedside due to inclement weather.  Patient is coming from assisted living facility at [facility].   Past Medical History:  Diagnosis Date  . Alzheimer's dementia   . Arthritis   . Thyroid disease    hypothyroidism   Family History  Problem Relation Age of Onset  . Dementia Mother   . Depression Father   . Dementia Sister   . Colon cancer Sister    Past Surgical History:  Procedure Laterality Date  . ABDOMINAL HYSTERECTOMY     Patient Active Problem List   Diagnosis Date Noted  . Altered mental status 01/13/2017  . Late onset Alzheimer's disease without behavioral disturbance 04/03/2016  . Hypothyroidism 04/03/2016  . Mixed obsessional thoughts and acts 04/03/2016  . Arthritis 02/28/2016      Prior to Admission medications   Medication Sig Start Date End Date Taking? Authorizing Provider  cephALEXin (KEFLEX) 500 MG capsule Take 1 capsule (500 mg total) by mouth every 12 (twelve) hours. 01/15/17   Enedina FinnerPatel, Sona, MD  Cholecalciferol (VITAMIN D3) 2000 units TABS Take 2,000 Units by mouth daily.    [provider]  galantamine (RAZADYNE) 8 MG tablet Take 1 tablet (8 mg total) by mouth daily. Patient not taking: Reported on 03/11/2016 02/28/16   Maple HudsonGilbert, Richard L Jr., MD    levothyroxine (SYNTHROID, LEVOTHROID) 100 MCG tablet Take 100 mcg by mouth daily before breakfast.    [provider]  levothyroxine (SYNTHROID, LEVOTHROID) 25 MCG tablet Take 1 tablet (25 mcg total) by mouth daily before breakfast. Patient not taking: Reported on 03/11/2016 03/01/16   Maple HudsonGilbert, Richard L Jr., MD  memantine (NAMENDA) 10 MG tablet Take 10 mg by mouth 2 (two) times daily.    [provider]  risperiDONE (RISPERDAL) 0.5 MG tablet Take 0.75 mg by mouth at bedtime.    [provider]  sertraline (ZOLOFT) 50 MG tablet Take 75 mg by mouth daily.    [provider]  traZODone (DESYREL) 50 MG tablet Take 25 mg by mouth at bedtime.    [provider]    Allergies Venlafaxine hcl    Social History Social History   Tobacco Use  . Smoking status: Never Smoker  . Smokeless tobacco: Never Used  Substance Use Topics  . Alcohol use: No  . Drug use: No    Review of Systems Patient denies headaches, rhinorrhea, blurry vision, numbness, shortness of breath, chest pain, edema, cough, abdominal pain, nausea, vomiting, diarrhea, dysuria, fevers, rashes or hallucinations unless otherwise stated above in HPI. ____________________________________________   PHYSICAL EXAM:  VITAL SIGNS: Vitals:   03/03/17 1930 03/03/17 2000  BP: (!) 152/84 (!) 152/66  Pulse:  (!) 107  Resp: 13   Temp:    SpO2:  100%  Constitutional: Alert uncomfortable appearing but in no acute distress. Eyes: Conjunctivae are normal.  Head: Atraumatic. Nose: No congestion/rhinnorhea. Mouth/Throat: Mucous membranes are moist.   Neck: No stridor. Painless ROM.  Cardiovascular: Normal rate, regular rhythm. Grossly normal heart sounds.  Good peripheral circulation. Respiratory: Normal respiratory effort.  No retractions. Lungs CTAB. Gastrointestinal: Soft and nontender. No distention. No abdominal bruits. No CVA tenderness. Musculoskeletal swelling and deformity to  left hip, compartments soft, left leg slightly shortened Neurologic:  . No gross focal neurologic deficits are appreciated. No facial droop Skin:  Skin is warm, dry and intact. No rash noted. Psychiatric: Mood and affect are normal. Speech and behavior are normal.  ____________________________________________   LABS (all labs ordered are listed, but only abnormal results are displayed)  Results for orders placed or performed during the hospital encounter of 03/03/17 (from the past 24 hour(s))  CBC with Differential/Platelet     Status: Abnormal   Collection Time: 03/03/17  7:21 PM  Result Value Ref Range   WBC 12.7 (H) 3.6 - 11.0 K/uL   RBC 4.48 3.80 - 5.20 MIL/uL   Hemoglobin 11.8 (L) 12.0 - 16.0 g/dL   HCT 16.136.7 09.635.0 - 04.547.0 %   MCV 82.0 80.0 - 100.0 fL   MCH 26.4 26.0 - 34.0 pg   MCHC 32.2 32.0 - 36.0 g/dL   RDW 40.915.8 (H) 81.111.5 - 91.414.5 %   Platelets 257 150 - 440 K/uL   Neutrophils Relative % 89 %   Neutro Abs 11.3 (H) 1.4 - 6.5 K/uL   Lymphocytes Relative 5 %   Lymphs Abs 0.6 (L) 1.0 - 3.6 K/uL   Monocytes Relative 5 %   Monocytes Absolute 0.6 0.2 - 0.9 K/uL   Eosinophils Relative 1 %   Eosinophils Absolute 0.1 0 - 0.7 K/uL   Basophils Relative 0 %   Basophils Absolute 0.0 0 - 0.1 K/uL  Comprehensive metabolic panel     Status: Abnormal   Collection Time: 03/03/17  7:21 PM  Result Value Ref Range   Sodium 135 135 - 145 mmol/L   Potassium 4.5 3.5 - 5.1 mmol/L   Chloride 103 101 - 111 mmol/L   CO2 22 22 - 32 mmol/L   Glucose, Bld 210 (H) 65 - 99 mg/dL   BUN 48 (H) 6 - 20 mg/dL   Creatinine, Ser 7.821.03 (H) 0.44 - 1.00 mg/dL   Calcium 9.4 8.9 - 95.610.3 mg/dL   Total Protein 7.6 6.5 - 8.1 g/dL   Albumin 3.5 3.5 - 5.0 g/dL   AST 37 15 - 41 U/L   ALT 25 14 - 54 U/L   Alkaline Phosphatase 88 38 - 126 U/L   Total Bilirubin 0.5 0.3 - 1.2 mg/dL   GFR calc non Af Amer 48 (L) >60 mL/min   GFR calc Af Amer 56 (L) >60 mL/min   Anion gap 10 5 - 15  Troponin I     Status: None    Collection Time: 03/03/17  7:21 PM  Result Value Ref Range   Troponin I <0.03 <0.03 ng/mL   ____________________________________________  EKG My review and personal interpretation at Time: 19:32   Indication: fall  Rate: 105  Rhythm: sinus Axis: normal Other: nonspecific st changes, no stemi, normal intervals ____________________________________________  RADIOLOGY  I personally reviewed all radiographic images ordered to evaluate for the above acute complaints and reviewed radiology reports and findings.  These findings were personally discussed with the patient.  Please see medical record for radiology  report.  ____________________________________________   PROCEDURES  Procedure(s) performed:  Procedures    Critical Care performed: no ____________________________________________   INITIAL IMPRESSION / ASSESSMENT AND PLAN / ED COURSE  Pertinent labs & imaging results that were available during my care of the patient were reviewed by me and considered in my medical decision making (see chart for details).  DDX: fracture, contusion, dislocation, dehydration  Hester Joslin is a 83 y.o. who presents to the ED with fall and left femoral neck fracture as described above.  No report of head injury.  Was a witnessed fall.  Patient is a DNR.  Workup in the ER shows evidence of fracture as described above.  Spoke with Dr. Martha Clan of orthopedics who agrees to evaluate patient.  Spoke with Dr. Katheren Shams of hospitalist group who agrees to admit patient for pain control and further evaluation and management.      ____________________________________________   FINAL CLINICAL IMPRESSION(S) / ED DIAGNOSES  Final diagnoses:  Left hip pain  Closed fracture of neck of left femur, initial encounter (HCC)      NEW MEDICATIONS STARTED DURING THIS VISIT:  This SmartLink is deprecated. Use AVSMEDLIST instead to display the medication list for a patient.   Note:  This document  was prepared using Dragon voice recognition software and may include unintentional dictation errors.    Willy Eddy, MD 03/03/17 2011

## 2017-03-03 NOTE — ED Triage Notes (Signed)
Pt to rm 18 via EMS [facility], report a fall two days ago, family declined having her taken to hospital, fell again today.  Pt c/o pain to left

## 2017-03-03 NOTE — ED Notes (Signed)
Pt daughter and son in law called RN to check on Pt  Alejandra Armstrong, Alejandra Armstrong  They stated they are the POA for the patient and are requesting to be kept updated on the pt status and condition.  Mr. [name] states they tried to make it to the hospital but are currently unable to get out of their driveway due to snow   Contact information: [phone]

## 2017-03-03 NOTE — H&P (Signed)
[employer] - [place] [facility]   PATIENT NAME: Alejandra Armstrong    MR#:  [id number]  DATE OF BIRTH:  07/01/1929  DATE OF ADMISSION:  03/03/2017  PRIMARY CARE PHYSICIAN: Maple Hudson., MD   REQUESTING/REFERRING PHYSICIAN:   CHIEF COMPLAINT:   Chief Complaint  Patient presents with  . Fall  . Hip Pain    HISTORY OF PRESENT ILLNESS: Alejandra Armstrong  is a 83 y.o. female with a known history per below presented to the emergency room with second fall in the last 2 days, also fell 2 days ago, decreased movement/ambulation, ER workup noted for left femoral neck fracture, patient evaluated in the emergency room, no apparent distress, patient is a poor historian due to Alzheimer's dementia, patient is now being admitted for acute left femoral neck fracture s/p probable mechanical fall.  PAST MEDICAL HISTORY:   Past Medical History:  Diagnosis Date  . Alzheimer's dementia   . Arthritis   . Thyroid disease    hypothyroidism    PAST SURGICAL HISTORY:  Past Surgical History:  Procedure Laterality Date  . ABDOMINAL HYSTERECTOMY      SOCIAL HISTORY:  Social History   Tobacco Use  . Smoking status: Never Smoker  . Smokeless tobacco: Never Used  Substance Use Topics  . Alcohol use: No    FAMILY HISTORY:  Family History  Problem Relation Age of Onset  . Dementia Mother   . Depression Father   . Dementia Sister   . Colon cancer Sister     DRUG ALLERGIES:  Allergies  Allergen Reactions  . Venlafaxine Hcl Other (See Comments)    Hallucinations, combative behavior    REVIEW OF SYSTEMS:  Unable to be obtained given Alzheimer's disease  MEDICATIONS AT HOME:  Prior to Admission medications   Medication Sig Start Date End Date Taking? Authorizing Provider  cephALEXin (KEFLEX) 500 MG capsule Take 1 capsule (500 mg total) by mouth every 12 (twelve) hours. 01/15/17   Enedina Finner, MD  Cholecalciferol (VITAMIN D3) 2000 units TABS Take 2,000 Units by mouth  daily.    [provider]  galantamine (RAZADYNE) 8 MG tablet Take 1 tablet (8 mg total) by mouth daily. Patient not taking: Reported on 03/11/2016 02/28/16   Maple Hudson., MD  levothyroxine (SYNTHROID, LEVOTHROID) 100 MCG tablet Take 100 mcg by mouth daily before breakfast.    [provider]  levothyroxine (SYNTHROID, LEVOTHROID) 25 MCG tablet Take 1 tablet (25 mcg total) by mouth daily before breakfast. Patient not taking: Reported on 03/11/2016 03/01/16   Maple Hudson., MD  memantine (NAMENDA) 10 MG tablet Take 10 mg by mouth 2 (two) times daily.    [provider]  risperiDONE (RISPERDAL) 0.5 MG tablet Take 0.75 mg by mouth at bedtime.    [provider]  sertraline (ZOLOFT) 50 MG tablet Take 75 mg by mouth daily.    [provider]  traZODone (DESYREL) 50 MG tablet Take 25 mg by mouth at bedtime.    [provider]      PHYSICAL EXAMINATION:   VITAL SIGNS: Blood pressure (!) 152/66, pulse (!) 107, temperature (!) 97.5 F (36.4 C), temperature source Oral, resp. rate 13, weight 51.7 kg (114 lb), SpO2 100 %.  GENERAL:  83 y.o.-year-old patient lying in the bed with no acute distress.  Frail appearing EYES: Pupils equal, round, reactive to light and accommodation. No scleral icterus. Extraocular muscles intact.  HEENT: Head atraumatic, normocephalic. Oropharynx and  nasopharynx clear.  NECK:  Supple, no jugular venous distention. No thyroid enlargement, no tenderness.  LUNGS: Normal breath sounds bilaterally, no wheezing, rales,rhonchi or crepitation. No use of accessory muscles of respiration.  CARDIOVASCULAR: S1, S2 normal. No murmurs, rubs, or gallops.  ABDOMEN: Soft, nontender, nondistended. Bowel sounds present. No organomegaly or mass.  EXTREMITIES: No pedal edema, cyanosis, or clubbing.  NEUROLOGIC: Cranial nerves II through XII are intact. MAES. Gait not checked.  PSYCHIATRIC: The patient is alert, awake,  confused/disoriented  SKIN: No obvious rash, lesion, or ulcer.   LABORATORY PANEL:   CBC Recent Labs  Lab 03/03/17 1921  WBC 12.7*  HGB 11.8*  HCT 36.7  PLT 257  MCV 82.0  MCH 26.4  MCHC 32.2  RDW 15.8*  LYMPHSABS 0.6*  MONOABS 0.6  EOSABS 0.1  BASOSABS 0.0   ------------------------------------------------------------------------------------------------------------------  Chemistries  Recent Labs  Lab 03/03/17 1921  NA 135  K 4.5  CL 103  CO2 22  GLUCOSE 210*  BUN 48*  CREATININE 1.03*  CALCIUM 9.4  AST 37  ALT 25  ALKPHOS 88  BILITOT 0.5   ------------------------------------------------------------------------------------------------------------------ estimated creatinine clearance is 28.7 mL/min (A) (by C-G formula based on SCr of 1.03 mg/dL (H)). ------------------------------------------------------------------------------------------------------------------ No results for input(s): TSH, T4TOTAL, T3FREE, THYROIDAB in the last 72 hours.  Invalid input(s): FREET3   Coagulation profile No results for input(s): INR, PROTIME in the last 168 hours. ------------------------------------------------------------------------------------------------------------------- No results for input(s): DDIMER in the last 72 hours. -------------------------------------------------------------------------------------------------------------------  Cardiac Enzymes Recent Labs  Lab 03/03/17 1921  TROPONINI <0.03   ------------------------------------------------------------------------------------------------------------------ Invalid input(s): POCBNP  ---------------------------------------------------------------------------------------------------------------  Urinalysis    Component Value Date/Time   COLORURINE AMBER (A) 01/13/2017 0707   APPEARANCEUR CLOUDY (A) 01/13/2017 0707   APPEARANCEUR Clear 02/28/2016 1657   LABSPEC 1.015 01/13/2017 0707   PHURINE 6.0  01/13/2017 0707   GLUCOSEU NEGATIVE 01/13/2017 0707   HGBUR SMALL (A) 01/13/2017 0707   BILIRUBINUR NEGATIVE 01/13/2017 0707   BILIRUBINUR Negative 02/28/2016 1657   KETONESUR NEGATIVE 01/13/2017 0707   PROTEINUR 100 (A) 01/13/2017 0707   NITRITE NEGATIVE 01/13/2017 0707   LEUKOCYTESUR LARGE (A) 01/13/2017 0707   LEUKOCYTESUR Trace (A) 02/28/2016 1657     RADIOLOGY: Dg Chest 1 View  Result Date: 03/03/2017 CLINICAL DATA:  Recent fall with known left hip fracture EXAM: CHEST 1 VIEW COMPARISON:  01/14/2017 FINDINGS: Cardiac shadow is within normal limits. Aortic calcifications are seen. The lungs are well aerated bilaterally. Degenerative changes of the thoracic spine are seen. No acute abnormality noted. IMPRESSION: No acute abnormality noted. Electronically Signed   By: Alcide CleverMark  Lukens M.D.   On: 03/03/2017 18:59   Dg Hip Unilat W Or Wo Pelvis 2-3 Views Left  Result Date: 03/03/2017 CLINICAL DATA:  Fall 2 days ago with left hip pain, initial encounter EXAM: DG HIP (WITH OR WITHOUT PELVIS) 2-3V LEFT COMPARISON:  None. FINDINGS: The pelvic ring is intact. There is a subcapital femoral neck fracture on the left with impaction and angulation at the fracture site. Femoral head is well seated. A vague lucency is noted over the inferior pubic ramus on the left which may represent an undisplaced fracture. No other focal abnormality is seen. IMPRESSION: Subcapital femoral neck fracture on the left. Suspicious lucency in the left inferior pubic ramus which may represent an undisplaced fracture. Electronically Signed   By: Alcide CleverMark  Lukens M.D.   On: 03/03/2017 18:58    EKG: Orders placed or performed during the hospital encounter of 03/03/17  . ED  EKG  . ED EKG    IMPRESSION AND PLAN: 1 acute left femoral neck fracture Status post probable mechanical fall Admit to regular nursing floor bed, consult orthopedic surgery for operative intervention, check EKG, I do not pain protocol, and continue close  medical monitoring If EKG is benign, patient operative risk for cardiac death/acute stroke/acute cardiac arrest is less than 2%  2 chronic Alzheimer's disease Appears stable Complete MAR when available  3 chronic hypothyroidism Check TSH Complete MAR when available  4 incomplete MAR Complete MAR when available  5 acute on chronic osteoarthritis Stable  Plan per above      All the records are reviewed and case discussed with ED provider. Management plans discussed with the patient, family and they are in agreement.  CODE STATUS:    Code Status Orders  (From admission, onward)        Start     Ordered   03/03/17 1922  Do not attempt resuscitation/DNR  Continuous    Question Answer Comment  In the event of cardiac or respiratory ARREST Do not call a "code blue"   In the event of cardiac or respiratory ARREST Do not perform Intubation, CPR, defibrillation or ACLS   In the event of cardiac or respiratory ARREST Use medication by any route, position, wound care, and other measures to relive pain and suffering. May use oxygen, suction and manual treatment of airway obstruction as needed for comfort.      03/03/17 1921    Code Status History    Date Active Date Inactive Code Status Order ID Comments User Context   01/13/2017 13:46 01/15/2017 15:05 Full Code [id number]  [place] SirenSainani, Vivek J, MD ED       TOTAL TIME TAKING CARE OF THIS PATIENT: 45 minutes.    Evelena AsaMontell D Salary M.D on 03/03/2017   Between 7am to 6pm - Pager - [phone]  After 6pm go to [email or web address] - password [email or web address] [facility]  [facility] [place] [facility]  Office  [phone]  CC: Primary care physician; Maple HudsonGilbert, Richard L Jr., MD   Note: This dictation was prepared with Dragon dictation along with smaller phrase technology. Any transcriptional errors that result from this process are unintentional.

## 2017-03-03 NOTE — Progress Notes (Signed)

## 2017-03-04 ENCOUNTER — Encounter: Payer: Self-pay | Admitting: Anesthesiology

## 2017-03-04 ENCOUNTER — Other Ambulatory Visit: Payer: Self-pay

## 2017-03-04 ENCOUNTER — Inpatient Hospital Stay
Admit: 2017-03-04 | Discharge: 2017-03-04 | Disposition: A | Discharge status: Home / Self Care | Payer: Medicare Other | Attending: Cardiovascular Disease | Admitting: Cardiovascular Disease

## 2017-03-04 ENCOUNTER — Inpatient Hospital Stay: Payer: Medicare Other | Admitting: Certified Registered Nurse Anesthetist

## 2017-03-04 ENCOUNTER — Encounter
Admission: EM | Disposition: A | Discharge status: Hospice | Payer: Self-pay | Source: Home / Self Care | Attending: Internal Medicine

## 2017-03-04 ENCOUNTER — Inpatient Hospital Stay: Payer: Medicare Other

## 2017-03-04 HISTORY — PX: HIP ARTHROPLASTY: SHX981

## 2017-03-04 LAB — CBC
HEMATOCRIT: 32.2 % — AB (ref 35.0–47.0)
Hemoglobin: 10.6 g/dL — ABNORMAL LOW (ref 12.0–16.0)
MCH: 26.3 pg (ref 26.0–34.0)
MCHC: 33 g/dL (ref 32.0–36.0)
MCV: 79.6 fL — AB (ref 80.0–100.0)
PLATELETS: 230 10*3/uL (ref 150–440)
RBC: 4.04 MIL/uL (ref 3.80–5.20)
RDW: 15.3 % — ABNORMAL HIGH (ref 11.5–14.5)
WBC: 10.4 10*3/uL (ref 3.6–11.0)

## 2017-03-04 LAB — BASIC METABOLIC PANEL
ANION GAP: 10 (ref 5–15)
BUN: 43 mg/dL — ABNORMAL HIGH (ref 6–20)
CO2: 23 mmol/L (ref 22–32)
CREATININE: 0.93 mg/dL (ref 0.44–1.00)
Calcium: 9.2 mg/dL (ref 8.9–10.3)
Chloride: 101 mmol/L (ref 101–111)
GFR calc Af Amer: >60 mL/min (ref >60)
GFR calc non Af Amer: 54 mL/min — ABNORMAL LOW (ref >60)
Glucose, Bld: 156 mg/dL — ABNORMAL HIGH (ref 65–99)
Potassium: 4.3 mmol/L (ref 3.5–5.1)
SODIUM: 134 mmol/L — AB (ref 135–145)

## 2017-03-04 LAB — APTT: aPTT: 32 seconds (ref 24–36)

## 2017-03-04 LAB — TROPONIN I: Troponin I: 0.03 ng/mL (ref <0.03)

## 2017-03-04 LAB — PROTIME-INR
INR: 0.96
PROTHROMBIN TIME: 12.7 s (ref 11.4–15.2)

## 2017-03-04 LAB — TSH: TSH: 0.851 u[IU]/mL (ref 0.350–4.500)

## 2017-03-04 LAB — ECHOCARDIOGRAM COMPLETE: Weight: 1731.2 oz

## 2017-03-04 LAB — SURGICAL PCR SCREEN
MRSA, PCR: NEGATIVE
Staphylococcus aureus: NEGATIVE

## 2017-03-04 SURGERY — HEMIARTHROPLASTY, HIP, DIRECT ANTERIOR APPROACH, FOR FRACTURE
Anesthesia: Spinal | Laterality: Left

## 2017-03-04 MED ORDER — NITROFURANTOIN MACROCRYSTAL 100 MG PO CAPS
100.0000 mg | ORAL_CAPSULE | Freq: Every day | ORAL | Status: DC
Start: 2017-03-04 — End: 2017-03-05
  Administered 2017-03-04: 100 mg via ORAL
  Filled 2017-03-04 (×2): qty 1

## 2017-03-04 MED ORDER — DEXTROSE 5 % IV SOLN
500.0000 mg | Freq: Four times a day (QID) | INTRAVENOUS | Status: DC | PRN
  Filled 2017-03-04: qty 5

## 2017-03-04 MED ORDER — NEOMYCIN-POLYMYXIN B GU 40-200000 IR SOLN
Status: AC
  Filled 2017-03-04: qty 20

## 2017-03-04 MED ORDER — OXYCODONE HCL 5 MG PO TABS
5.0000 mg | ORAL_TABLET | ORAL | Status: DC | PRN
  Administered 2017-03-04: 5 mg via ORAL
  Filled 2017-03-04: qty 1

## 2017-03-04 MED ORDER — PHENYLEPH-SHARK LIV OIL-MO-PET 0.25-3-14-71.9 % RE OINT
1.0000 "application " | TOPICAL_OINTMENT | RECTAL | Status: DC | PRN
  Filled 2017-03-04: qty 28

## 2017-03-04 MED ORDER — ENOXAPARIN SODIUM 30 MG/0.3ML ~~LOC~~ SOLN
30.0000 mg | SUBCUTANEOUS | Status: DC
  Administered 2017-03-05: 30 mg via SUBCUTANEOUS
  Filled 2017-03-04: qty 0.3

## 2017-03-04 MED ORDER — SENNA 8.6 MG PO TABS
1.0000 | ORAL_TABLET | Freq: Two times a day (BID) | ORAL | Status: DC
  Administered 2017-03-04 – 2017-03-05 (×2): 8.6 mg via ORAL
  Filled 2017-03-04 (×2): qty 1

## 2017-03-04 MED ORDER — WHITE PETROLATUM EX OINT
TOPICAL_OINTMENT | CUTANEOUS | Status: AC
  Filled 2017-03-04: qty 5

## 2017-03-04 MED ORDER — SENNOSIDES-DOCUSATE SODIUM 8.6-50 MG PO TABS
1.0000 | ORAL_TABLET | Freq: Every evening | ORAL | Status: DC | PRN
  Administered 2017-03-04: 1 via ORAL
  Filled 2017-03-04: qty 1

## 2017-03-04 MED ORDER — LACTATED RINGERS IV SOLN
INTRAVENOUS | Status: DC
  Administered 2017-03-04: 15:00:00 via INTRAVENOUS
  Administered 2017-03-04: 100 mL/h via INTRAVENOUS

## 2017-03-04 MED ORDER — ACETAMINOPHEN 10 MG/ML IV SOLN
INTRAVENOUS | Status: DC | PRN
  Administered 2017-03-04: 1000 mg via INTRAVENOUS

## 2017-03-04 MED ORDER — PHENYLEPHRINE HCL 10 MG/ML IJ SOLN
INTRAMUSCULAR | Status: DC | PRN
  Administered 2017-03-04: 50 ug/min via INTRAVENOUS

## 2017-03-04 MED ORDER — SULFAMETHOXAZOLE-TRIMETHOPRIM 800-160 MG PO TABS
1.0000 | ORAL_TABLET | Freq: Two times a day (BID) | ORAL | Status: DC
  Administered 2017-03-04 – 2017-03-05 (×2): 1 via ORAL
  Filled 2017-03-04 (×2): qty 1

## 2017-03-04 MED ORDER — MIRTAZAPINE 7.5 MG PO TABS
7.5000 mg | ORAL_TABLET | Freq: Every day | ORAL | Status: DC
  Administered 2017-03-04: 7.5 mg via ORAL
  Filled 2017-03-04: qty 1

## 2017-03-04 MED ORDER — PHENYLEPHRINE HCL 10 MG/ML IJ SOLN
INTRAMUSCULAR | Status: DC | PRN
  Administered 2017-03-04: 200 ug via INTRAVENOUS
  Administered 2017-03-04: 50 ug via INTRAVENOUS
  Administered 2017-03-04: 100 ug via INTRAVENOUS
  Administered 2017-03-04: 200 ug via INTRAVENOUS

## 2017-03-04 MED ORDER — GLYCOPYRROLATE 0.2 MG/ML IJ SOLN
INTRAMUSCULAR | Status: DC | PRN
  Administered 2017-03-04: 0.2 mg via INTRAVENOUS

## 2017-03-04 MED ORDER — PROPOFOL 500 MG/50ML IV EMUL
INTRAVENOUS | Status: DC | PRN
  Administered 2017-03-04: 35 ug/kg/min via INTRAVENOUS

## 2017-03-04 MED ORDER — CEFAZOLIN SODIUM-DEXTROSE 2-4 GM/100ML-% IV SOLN
INTRAVENOUS | Status: AC
  Filled 2017-03-04: qty 100

## 2017-03-04 MED ORDER — PROPOFOL 500 MG/50ML IV EMUL
INTRAVENOUS | Status: AC
  Filled 2017-03-04: qty 50

## 2017-03-04 MED ORDER — BISACODYL 10 MG RE SUPP: 10.0000 mg | Freq: Every day | RECTAL | Status: DC | PRN

## 2017-03-04 MED ORDER — TRAZODONE 25 MG HALF TABLET
25.0000 mg | ORAL_TABLET | Freq: Every day | ORAL | Status: DC
  Administered 2017-03-04: 25 mg via ORAL
  Filled 2017-03-04 (×2): qty 1

## 2017-03-04 MED ORDER — MAGNESIUM CITRATE PO SOLN
1.0000 | Freq: Once | ORAL | Status: DC | PRN
  Filled 2017-03-04: qty 296

## 2017-03-04 MED ORDER — METHOCARBAMOL 500 MG PO TABS: 500.0000 mg | ORAL_TABLET | Freq: Four times a day (QID) | ORAL | Status: DC | PRN

## 2017-03-04 MED ORDER — FENTANYL CITRATE (PF) 100 MCG/2ML IJ SOLN: 25.0000 ug | INTRAMUSCULAR | Status: DC | PRN

## 2017-03-04 MED ORDER — RISPERIDONE 0.5 MG PO TBDP
0.2500 mg | ORAL_TABLET | Freq: Every day | ORAL | Status: DC
  Administered 2017-03-04: 0.25 mg via ORAL
  Filled 2017-03-04 (×2): qty 0.5

## 2017-03-04 MED ORDER — ZINC OXIDE 40 % EX OINT
1.0000 "application " | TOPICAL_OINTMENT | CUTANEOUS | Status: DC | PRN
  Filled 2017-03-04: qty 57

## 2017-03-04 MED ORDER — MEMANTINE HCL 5 MG PO TABS
5.0000 mg | ORAL_TABLET | Freq: Two times a day (BID) | ORAL | Status: DC
  Administered 2017-03-04 – 2017-03-05 (×2): 5 mg via ORAL
  Filled 2017-03-04 (×2): qty 1

## 2017-03-04 MED ORDER — SODIUM CHLORIDE 0.9 % IV SOLN
75.0000 mL/h | INTRAVENOUS | Status: DC
  Administered 2017-03-04 – 2017-03-05 (×2): 75 mL/h via INTRAVENOUS

## 2017-03-04 MED ORDER — BUPIVACAINE HCL (PF) 0.5 % IJ SOLN
INTRAMUSCULAR | Status: DC | PRN
  Administered 2017-03-04: 3 mL

## 2017-03-04 MED ORDER — ONDANSETRON HCL 4 MG/2ML IJ SOLN
4.0000 mg | Freq: Once | INTRAMUSCULAR | Status: DC | PRN
Start: 2017-03-04 — End: 2017-03-04

## 2017-03-04 MED ORDER — CEFAZOLIN SODIUM-DEXTROSE 1-4 GM/50ML-% IV SOLN: 1.0000 g | Freq: Four times a day (QID) | INTRAVENOUS | Status: DC

## 2017-03-04 MED ORDER — NEOMYCIN-POLYMYXIN B GU 40-200000 IR SOLN
Status: DC | PRN
  Administered 2017-03-04: 16 mL

## 2017-03-04 MED ORDER — LEVOTHYROXINE SODIUM 125 MCG PO TABS
125.0000 ug | ORAL_TABLET | Freq: Every day | ORAL | Status: DC
  Administered 2017-03-04 – 2017-03-05 (×2): 125 ug via ORAL
  Filled 2017-03-04 (×2): qty 1

## 2017-03-04 MED ORDER — ACETAMINOPHEN 10 MG/ML IV SOLN
INTRAVENOUS | Status: AC
  Filled 2017-03-04: qty 100

## 2017-03-04 MED ORDER — PROPOFOL 10 MG/ML IV BOLUS
INTRAVENOUS | Status: DC | PRN
  Administered 2017-03-04 (×3): 10 mg via INTRAVENOUS

## 2017-03-04 MED ORDER — CEFAZOLIN SODIUM-DEXTROSE 2-4 GM/100ML-% IV SOLN
2.0000 g | Freq: Once | INTRAVENOUS | Status: AC
  Administered 2017-03-04: 2 g via INTRAVENOUS

## 2017-03-04 MED ORDER — FERROUS SULFATE 325 (65 FE) MG PO TABS
325.0000 mg | ORAL_TABLET | Freq: Three times a day (TID) | ORAL | Status: DC
  Administered 2017-03-04 – 2017-03-05 (×2): 325 mg via ORAL
  Filled 2017-03-04 (×2): qty 1

## 2017-03-04 MED ORDER — DEXTROSE 5 % IV SOLN
Freq: Four times a day (QID) | INTRAVENOUS | Status: AC
  Administered 2017-03-04 – 2017-03-05 (×2): via INTRAVENOUS
  Filled 2017-03-04 (×3): qty 10

## 2017-03-04 SURGICAL SUPPLY — 58 items
BLADE SAGITTAL WIDE XTHICK NO (BLADE) ×3 IMPLANT
BLADE SURG SZ10 CARB STEEL (BLADE) ×3 IMPLANT
BNDG COHESIVE 4X5 TAN STRL (GAUZE/BANDAGES/DRESSINGS) ×3 IMPLANT
CANISTER SUCT 1200ML W/VALVE (MISCELLANEOUS) ×3 IMPLANT
CANISTER SUCT 3000ML PPV (MISCELLANEOUS) ×6 IMPLANT
CAPT HIP HEMI 2 ×3 IMPLANT
DRAPE IMP U-DRAPE 54X76 (DRAPES) ×3 IMPLANT
DRAPE INCISE IOBAN 66X60 STRL (DRAPES) ×3 IMPLANT
DRAPE SHEET LG 3/4 BI-LAMINATE (DRAPES) ×6 IMPLANT
DRAPE SURG 17X11 SM STRL (DRAPES) ×6 IMPLANT
DRAPE TABLE BACK 80X90 (DRAPES) ×3 IMPLANT
DRSG OPSITE POSTOP 4X10 (GAUZE/BANDAGES/DRESSINGS) ×3 IMPLANT
DURAPREP 26ML APPLICATOR (WOUND CARE) ×9 IMPLANT
ELECT BLADE 6.5 EXT (BLADE) ×3 IMPLANT
ELECT CAUTERY BLADE 6.4 (BLADE) ×3 IMPLANT
ELECT REM PT RETURN 9FT ADLT (ELECTROSURGICAL) ×3
ELECTRODE REM PT RTRN 9FT ADLT (ELECTROSURGICAL) ×1 IMPLANT
GAUZE PETRO XEROFOAM 1X8 (MISCELLANEOUS) ×3 IMPLANT
GAUZE SPONGE 4X4 12PLY STRL (GAUZE/BANDAGES/DRESSINGS) ×3 IMPLANT
GLOVE BIO SURGEON STRL SZ 6.5 (GLOVE) ×4 IMPLANT
GLOVE BIO SURGEONS STRL SZ 6.5 (GLOVE) ×2
GLOVE BIOGEL PI IND STRL 7.0 (GLOVE) ×6 IMPLANT
GLOVE BIOGEL PI IND STRL 9 (GLOVE) ×1 IMPLANT
GLOVE BIOGEL PI INDICATOR 7.0 (GLOVE) ×12
GLOVE BIOGEL PI INDICATOR 9 (GLOVE) ×2
GLOVE SURG 9.0 ORTHO LTXF (GLOVE) ×6 IMPLANT
GOWN STRL REUS TWL 2XL XL LVL4 (GOWN DISPOSABLE) ×3 IMPLANT
GOWN STRL REUS W/ TWL LRG LVL3 (GOWN DISPOSABLE) ×4 IMPLANT
GOWN STRL REUS W/TWL LRG LVL3 (GOWN DISPOSABLE) ×8
HEMOVAC 400ML (MISCELLANEOUS) ×3
HIP CAPITATED HEMI 2 ×1 IMPLANT
KIT DRAIN HEMOVAC JP 7FR 400ML (MISCELLANEOUS) ×1 IMPLANT
KIT RM TURNOVER STRD PROC AR (KITS) ×3 IMPLANT
NDL SAFETY ECLIPSE 18X1.5 (NEEDLE) ×1 IMPLANT
NEEDLE FILTER BLUNT 18X 1/2SAF (NEEDLE) ×2
NEEDLE FILTER BLUNT 18X1 1/2 (NEEDLE) ×1 IMPLANT
NEEDLE HYPO 18GX1.5 SHARP (NEEDLE) ×2
NEEDLE MAYO CATGUT SZ4 (NEEDLE) ×3 IMPLANT
NS IRRIG 1000ML POUR BTL (IV SOLUTION) ×3 IMPLANT
PACK HIP PROSTHESIS (MISCELLANEOUS) ×3 IMPLANT
PILLOW ABDUC SM (MISCELLANEOUS) ×3 IMPLANT
PULSAVAC PLUS IRRIG FAN TIP (DISPOSABLE) ×3
RETRIEVER SUT HEWSON (MISCELLANEOUS) ×3 IMPLANT
SLEEVE CABLE 2MM VT (Orthopedic Implant) ×3 IMPLANT
SOL .9 NS 3000ML IRR  AL (IV SOLUTION) ×2
SOL .9 NS 3000ML IRR UROMATIC (IV SOLUTION) ×1 IMPLANT
STAPLER SKIN PROX 35W (STAPLE) ×3 IMPLANT
SUT TICRON 2-0 30IN 311381 (SUTURE) ×9 IMPLANT
SUT VIC AB 0 CT1 36 (SUTURE) ×6 IMPLANT
SUT VIC AB 2-0 CT2 27 (SUTURE) ×6 IMPLANT
SYR 10ML LL (SYRINGE) ×3 IMPLANT
TAPE MICROFOAM 4IN (TAPE) IMPLANT
TAPE TRANSPORE STRL 2 31045 (GAUZE/BANDAGES/DRESSINGS) ×3 IMPLANT
TIP BRUSH PULSAVAC PLUS 24.33 (MISCELLANEOUS) ×6 IMPLANT
TIP FAN IRRIG PULSAVAC PLUS (DISPOSABLE) ×1 IMPLANT
TUBING CONNECTING 10 (TUBING) ×2 IMPLANT
TUBING CONNECTING 10' (TUBING) ×1
WATER STERILE IRR 1000ML POUR (IV SOLUTION) ×3 IMPLANT

## 2017-03-04 NOTE — Clinical Social Work Note (Signed)
Clinical Social Work Assessment  Patient Details  Name: Alejandra Armstrong MRN: [id number] Date of Birth: 12-14-1929  Date of referral:  03/04/17               Reason for consult:  Facility Placement                Permission sought to share information with:  Chartered certified accountant granted to share information::  Yes, Verbal Permission Granted  Name::      [place]::   [place]   Relationship::     Contact Information:     Housing/Transportation Living arrangements for the past 2 months:  [place] of Information:  Adult Children Patient Interpreter Needed:  None Criminal Activity/Legal Involvement Pertinent to Current Situation/Hospitalization:  No - Comment as needed Significant Relationships:  Adult Children Lives with:  Facility Resident Do you feel safe going back to the place where you live?  Yes Need for family participation in patient care:  Yes (Comment)  Care giving concerns:  Patient is a resident at [facility].    Social Worker assessment / plan:  Holiday representative (CSW) reviewed chart and noted that patient is from [facility] and will have surgery for a hip fracture. CSW met with patient and her daughter Cornelius Moras [phone] and [name]'s significant other Dominica Severin [phone] were at bedside. Patient did not participate in assessment. CSW is familiar with patient and her family from previous admission. Per daughter patient will have surgery today for a hip fracture. CSW explained to daughter that patient will need to go to SNF for rehab for a few weeks under medicare. CSW explained that medicare requires a 3 night qualifying inpatient stay in a hospital in order to pay for SNF. Patient was admitted to inpatient on 03/03/17. Daughter verbalized her understanding and stated that they were also talking with [facility]. CSW explained that a palliative care consult is ordered while patient is  here at [facility]. CSW also explained that medicare can't be billed for rehab and hospice at the same time. CSW discussed long term care placement after rehab. Daughter understands that she will have to change patient's special assistance medicaid over to long term care SNF medicaid once patient is in a SNF. Daughter is agreeable for patient to go to rehab under medicare with palliative and then transition to long term care SNF under medicaid and bring in hospice when appropriate. Daughter is agreeable to SNF search in [place]. FL2 complete and faxed out. SNF list provided to daughter. CSW will continue to follow and assist as needed.   Employment status:  Disabled (Comment on whether or not currently receiving Disability), Retired Forensic scientist:  Medicare, Medicaid In [place] PT Recommendations:  Not assessed at this time Information / Referral to community resources:  [place]  Patient/Family's Response to care: Patient's daughter is agreeable to SNF search in [place].   Patient/Family's Understanding of and Emotional Response to Diagnosis, Current Treatment, and Prognosis:  Patient's daughter was very pleasant and thanked CSW for assistance.   Emotional Assessment Appearance:    Attitude/Demeanor/Rapport:    Affect (typically observed):  Pleasant Orientation:  Oriented to Self, Oriented to Place, Fluctuating Orientation (Suspected and/or reported Sundowners) Alcohol / Substance use:  Not Applicable Psych involvement (Current and /or in the community):  No (Comment)  Discharge Needs  Concerns to be addressed:  Discharge Planning Concerns Readmission within the last 30  days:  No Current discharge risk:  Dependent with Mobility Barriers to Discharge:  Continued Medical Work up   [employer], Veronia Beets, LCSW 03/04/2017, 12:00 PM

## 2017-03-04 NOTE — Anesthesia Procedure Notes (Signed)
Spinal  Patient location during procedure: OR Start time: 03/04/2017 12:42 PM End time: 03/04/2017 12:55 PM Staffing Anesthesiologist: Martha Clan, MD Resident/CRNA: Demetrius Charity, CRNA Performed: resident/CRNA  Preanesthetic Checklist Completed: patient identified, site marked, surgical consent, pre-op evaluation, timeout performed, IV checked, risks and benefits discussed and monitors and equipment checked Spinal Block Patient position: sitting Prep: ChloraPrep Patient monitoring: heart rate, continuous pulse ox, blood pressure and cardiac monitor Approach: midline Location: L3-4 Injection technique: single-shot Needle Needle type: Whitacre and Introducer  Needle gauge: 24 G Needle length: 9 cm Assessment Sensory level: T10 Additional Notes Negative paresthesia. Negative blood return. Positive free-flowing CSF. Expiration date of kit checked and confirmed. Patient tolerated procedure well, without complications.

## 2017-03-04 NOTE — Anesthesia Post-op Follow-up Note (Signed)
Anesthesia QCDR form completed.        

## 2017-03-04 NOTE — Consult Note (Signed)
Alejandra Armstrong is a 83 y.o. female  [id number]  Primary Cardiologist: Adrian BlackwaterShaukat Darrell Hauk   Reason for Consultation: Preoperative clearance  HPI: 83 year old female presented to the hospital after falling down and having left femoral neck fracture.   Review of Systems: No orthopnea PND or leg swelling   Past Medical History:  Diagnosis Date  . Alzheimer's dementia   . Arthritis   . Thyroid disease    hypothyroidism    Medications Prior to Admission  Medication Sig Dispense Refill  . cephALEXin (KEFLEX) 500 MG capsule Take 1 capsule (500 mg total) by mouth every 12 (twelve) hours. 12 capsule 0  . Cholecalciferol (VITAMIN D3) 2000 units TABS Take 2,000 Units by mouth daily.    Marland Kitchen. galantamine (RAZADYNE) 8 MG tablet Take 1 tablet (8 mg total) by mouth daily. (Patient not taking: Reported on 03/11/2016) 30 tablet 5  . levothyroxine (SYNTHROID, LEVOTHROID) 100 MCG tablet Take 100 mcg by mouth daily before breakfast.    . levothyroxine (SYNTHROID, LEVOTHROID) 25 MCG tablet Take 1 tablet (25 mcg total) by mouth daily before breakfast. (Patient not taking: Reported on 03/11/2016) 30 tablet 12  . memantine (NAMENDA) 10 MG tablet Take 10 mg by mouth 2 (two) times daily.    . risperiDONE (RISPERDAL) 0.5 MG tablet Take 0.75 mg by mouth at bedtime.    . sertraline (ZOLOFT) 50 MG tablet Take 75 mg by mouth daily.    . traZODone (DESYREL) 50 MG tablet Take 25 mg by mouth at bedtime.       . calcium-vitamin D  1 tablet Oral TID  . cephALEXin  500 mg Oral Q12H  . docusate sodium  100 mg Oral BID  . multivitamin with minerals  1 tablet Oral Daily  . sodium chloride flush  3 mL Intravenous Q12H    Infusions: . dextrose 5% lactated ringers 80 mL/hr at 03/03/17 2329    Allergies  Allergen Reactions  . Venlafaxine Hcl Other (See Comments)    Hallucinations, combative behavior    Social History   Socioeconomic History  . Marital status: Widowed    Spouse name: Not on file  . Number  of children: Not on file  . Years of education: Not on file  . Highest education level: Not on file  Social Needs  . Financial resource strain: Not on file  . Food insecurity - worry: Not on file  . Food insecurity - inability: Not on file  . Transportation needs - medical: Not on file  . Transportation needs - non-medical: Not on file  Occupational History  . Not on file  Tobacco Use  . Smoking status: Never Smoker  . Smokeless tobacco: Never Used  Substance and Sexual Activity  . Alcohol use: No  . Drug use: No  . Sexual activity: No  Other Topics Concern  . Not on file  Social History Narrative  . Not on file    Family History  Problem Relation Age of Onset  . Dementia Mother   . Depression Father   . Dementia Sister   . Colon cancer Sister     PHYSICAL EXAM: Vitals:   03/04/17 0555 03/04/17 0801  BP: (!) 138/41 (!) 125/43  Pulse: 95 94  Resp: 19 20  Temp: 98.8 F (37.1 C) 98.3 F (36.8 C)  SpO2: 93% 94%     Intake/Output Summary (Last 24 hours) at 03/04/2017 0842 Last data filed at 03/04/2017 0546 Gross per 24 hour  Intake 3 ml  Output 1050 ml  Net -1047 ml    General:  Well appearing. No respiratory difficulty HEENT: normal Neck: supple. no JVD. Carotids 2+ bilat; no bruits. No lymphadenopathy or thryomegaly appreciated. Cor: PMI nondisplaced. Regular rate & rhythm. No rubs, gallops or murmurs. Lungs: clear Abdomen: soft, nontender, nondistended. No hepatosplenomegaly. No bruits or masses. Good bowel sounds. Extremities: no cyanosis, clubbing, rash, edema Neuro: alert & oriented x 3, cranial nerves grossly intact. moves all 4 extremities w/o difficulty. Affect pleasant.  ECG: Sinus rhythm no acute changes  Results for orders placed or performed during the hospital encounter of 03/03/17 (from the past 24 hour(s))  CBC with Differential/Platelet     Status: Abnormal   Collection Time: 03/03/17  7:21 PM  Result Value Ref Range   WBC 12.7 (H) 3.6 -  11.0 K/uL   RBC 4.48 3.80 - 5.20 MIL/uL   Hemoglobin 11.8 (L) 12.0 - 16.0 g/dL   HCT 40.9 81.1 - 91.4 %   MCV 82.0 80.0 - 100.0 fL   MCH 26.4 26.0 - 34.0 pg   MCHC 32.2 32.0 - 36.0 g/dL   RDW 78.2 (H) 95.6 - 21.3 %   Platelets 257 150 - 440 K/uL   Neutrophils Relative % 89 %   Neutro Abs 11.3 (H) 1.4 - 6.5 K/uL   Lymphocytes Relative 5 %   Lymphs Abs 0.6 (L) 1.0 - 3.6 K/uL   Monocytes Relative 5 %   Monocytes Absolute 0.6 0.2 - 0.9 K/uL   Eosinophils Relative 1 %   Eosinophils Absolute 0.1 0 - 0.7 K/uL   Basophils Relative 0 %   Basophils Absolute 0.0 0 - 0.1 K/uL  Comprehensive metabolic panel     Status: Abnormal   Collection Time: 03/03/17  7:21 PM  Result Value Ref Range   Sodium 135 135 - 145 mmol/L   Potassium 4.5 3.5 - 5.1 mmol/L   Chloride 103 101 - 111 mmol/L   CO2 22 22 - 32 mmol/L   Glucose, Bld 210 (H) 65 - 99 mg/dL   BUN 48 (H) 6 - 20 mg/dL   Creatinine, Ser 0.86 (H) 0.44 - 1.00 mg/dL   Calcium 9.4 8.9 - 57.8 mg/dL   Total Protein 7.6 6.5 - 8.1 g/dL   Albumin 3.5 3.5 - 5.0 g/dL   AST 37 15 - 41 U/L   ALT 25 14 - 54 U/L   Alkaline Phosphatase 88 38 - 126 U/L   Total Bilirubin 0.5 0.3 - 1.2 mg/dL   GFR calc non Af Amer 48 (L) >60 mL/min   GFR calc Af Amer 56 (L) >60 mL/min   Anion gap 10 5 - 15  Troponin I     Status: None   Collection Time: 03/03/17  7:21 PM  Result Value Ref Range   Troponin I <0.03 <0.03 ng/mL  Urinalysis, Routine w reflex microscopic     Status: Abnormal   Collection Time: 03/03/17 10:27 PM  Result Value Ref Range   Color, Urine YELLOW (A) YELLOW   APPearance CLOUDY (A) CLEAR   Specific Gravity, Urine 1.024 1.005 - 1.030   pH 5.0 5.0 - 8.0   Glucose, UA NEGATIVE NEGATIVE mg/dL   Hgb urine dipstick NEGATIVE NEGATIVE   Bilirubin Urine NEGATIVE NEGATIVE   Ketones, ur NEGATIVE NEGATIVE mg/dL   Protein, ur NEGATIVE NEGATIVE mg/dL   Nitrite NEGATIVE NEGATIVE   Leukocytes, UA NEGATIVE NEGATIVE  Surgical pcr screen     Status: None    Collection Time:  03/03/17 11:17 PM  Result Value Ref Range   MRSA, PCR NEGATIVE NEGATIVE   Staphylococcus aureus NEGATIVE NEGATIVE  Protime-INR     Status: None   Collection Time: 03/04/17  1:12 AM  Result Value Ref Range   Prothrombin Time 12.7 11.4 - 15.2 seconds   INR 0.96   APTT     Status: None   Collection Time: 03/04/17  1:12 AM  Result Value Ref Range   aPTT 32 24 - 36 seconds  TSH     Status: None   Collection Time: 03/04/17  1:12 AM  Result Value Ref Range   TSH 0.851 0.350 - 4.500 uIU/mL  Troponin I     Status: None   Collection Time: 03/04/17  1:12 AM  Result Value Ref Range   Troponin I <0.03 <0.03 ng/mL  Basic metabolic panel     Status: Abnormal   Collection Time: 03/04/17  1:12 AM  Result Value Ref Range   Sodium 134 (L) 135 - 145 mmol/L   Potassium 4.3 3.5 - 5.1 mmol/L   Chloride 101 101 - 111 mmol/L   CO2 23 22 - 32 mmol/L   Glucose, Bld 156 (H) 65 - 99 mg/dL   BUN 43 (H) 6 - 20 mg/dL   Creatinine, Ser 9.140.93 0.44 - 1.00 mg/dL   Calcium 9.2 8.9 - 78.210.3 mg/dL   GFR calc non Af Amer 54 (L) >60 mL/min   GFR calc Af Amer >60 >60 mL/min   Anion gap 10 5 - 15  CBC     Status: Abnormal   Collection Time: 03/04/17  1:12 AM  Result Value Ref Range   WBC 10.4 3.6 - 11.0 K/uL   RBC 4.04 3.80 - 5.20 MIL/uL   Hemoglobin 10.6 (L) 12.0 - 16.0 g/dL   HCT 95.632.2 (L) 21.335.0 - 08.647.0 %   MCV 79.6 (L) 80.0 - 100.0 fL   MCH 26.3 26.0 - 34.0 pg   MCHC 33.0 32.0 - 36.0 g/dL   RDW 57.815.3 (H) 46.911.5 - 62.914.5 %   Platelets 230 150 - 440 K/uL  Troponin I     Status: Abnormal   Collection Time: 03/04/17  7:12 AM  Result Value Ref Range   Troponin I 0.03 (HH) <0.03 ng/mL   Dg Chest 1 View  Result Date: 03/03/2017 CLINICAL DATA:  Recent fall with known left hip fracture EXAM: CHEST 1 VIEW COMPARISON:  01/14/2017 FINDINGS: Cardiac shadow is within normal limits. Aortic calcifications are seen. The lungs are well aerated bilaterally. Degenerative changes of the thoracic spine are seen. No  acute abnormality noted. IMPRESSION: No acute abnormality noted. Electronically Signed   By: Alcide CleverMark  Lukens M.D.   On: 03/03/2017 18:59   Dg Hip Unilat W Or Wo Pelvis 2-3 Views Left  Result Date: 03/03/2017 CLINICAL DATA:  Fall 2 days ago with left hip pain, initial encounter EXAM: DG HIP (WITH OR WITHOUT PELVIS) 2-3V LEFT COMPARISON:  None. FINDINGS: The pelvic ring is intact. There is a subcapital femoral neck fracture on the left with impaction and angulation at the fracture site. Femoral head is well seated. A vague lucency is noted over the inferior pubic ramus on the left which may represent an undisplaced fracture. No other focal abnormality is seen. IMPRESSION: Subcapital femoral neck fracture on the left. Suspicious lucency in the left inferior pubic ramus which may represent an undisplaced fracture. Electronically Signed   By: Alcide CleverMark  Lukens M.D.   On: 03/03/2017 18:58  ASSESSMENT AND PLAN: Status post fall with pelvic fracture needs to have surgery. EKG is unremarkable and troponins are still within normal limits. Advise proceeding with surgery.  Schneur Crowson A

## 2017-03-04 NOTE — Progress Notes (Signed)
Inpatient Diabetes Program Recommendations  AACE/ADA: New Consensus Statement on Inpatient Glycemic Control (2015)  Target Ranges:  Prepandial:   less than 140 mg/dL      Peak postprandial:   less than 180 mg/dL (1-2 hours)      Critically ill patients:  140 - 180 mg/dL   Results for Alejandra Armstrong, Alejandra Armstrong (MRN [id number]) as of 03/04/2017 10:54  Ref. Range 03/03/2017 19:21 03/04/2017 01:12  Glucose Latest Ref Range: 65 - 99 mg/dL 409210 (H) 811156 (H)   Review of Glycemic Control  Diabetes history: No Outpatient Diabetes medications: NA Current orders for Inpatient glycemic control: None  Inpatient Diabetes Program Recommendations: HgbA1C: Noted initial glucose 210 mg/dl on 91/47/8211/01/10. May want to consider ordering an A1C to evaluate glycemic control over the past 2-3 months.  Thanks, Orlando PennerMarie Jeffrey Graefe, RN, MSN, CDE Diabetes Coordinator Inpatient Diabetes Program [phone] (Team Pager from 8am to 5pm)

## 2017-03-04 NOTE — Op Note (Signed)
03/04/2017  3:30 PM  PATIENT:  Alejandra Armstrong   MRN: [id number]  PRE-OPERATIVE DIAGNOSIS:  Displaced left femoral neck hip fracture  POST-OPERATIVE DIAGNOSIS:  same  PROCEDURE:  Procedure(s): LEFT HIP HEMIARTHROPLASTY   PREOPERATIVE INDICATIONS:    Gwendlyn Hanback is an 83 y.o. female who was admitted with a diagnosis of left hip fracture.  I have recommended surgical fixation with hemiarthroplasty for this injury. I have explained the surgery and the postoperative course to the patient's daughter and son-in-law who have agreed with surgical management of this fracture.    The risks benefits and alternatives were discussed with the patient and their family including but not limited to the risks of  infection requiring removal of the prosthesis, bleeding requiring blood transfusion, nerve injury especially to the sciatic nerve leading to foot drop or lower extremity numbness, periprosthetic fracture, dislocation leg length discrepancy, change in lower extremity rotation persistent hip pain, loosening or failure of the components and the need for revision surgery. Medical risks include but are not limited to DVT and pulmonary embolism, myocardial infarction, stroke, pneumonia, respiratory failure and death.  OPERATIVE REPORT     SURGEON:  Thornton Park, MD    ASSISTANT:  Surgical tech    ANESTHESIA:  spinal    COMPLICATIONS:  None.   SPECIMEN: Femoral head to pathology    COMPONENTS:  Stryker Accolade 2 femoral component size 3 with a 43 mm -4 neck adjustment sleeve.    PROCEDURE IN DETAIL:   The patient was met in the holding area and  identified.  The appropriate hip was identified and marked at the operative site after verbally confirming with the patient that this was the correct site of surgery.  The patient was then transported to the OR  and  underwent spinal anesthesia.  The patient was then placed in the lateral decubitus position with the operative side up and  secured on the operating room table with a pegboard and all bony prominences were adequately padded. This included an axillary roll and additional padding around the nonoperative leg to prevent compression to the common peroneal nerve.    The operative lower extremity was prepped and draped in a sterile fashion.  A time out was performed prior to incision to verify patient's name, date of birth, medical record number, correct site of surgery correct procedure to be performed. The timeout was also used to verify the patient received antibiotics now appropriate instruments, implants and radiographic studies were available in the room. Once all in attendance were in agreement case began.    A posterolateral approach was utilized via sharp dissection  carried down to the subcutaneous tissue.  Bleeding vessels were coagulated using electrocautery.  The fascia lata was identified and incised along the length of the skin incision.  The gluteus maximus muscle was then split in line with its fibers. Self-retaining retractors were  inserted.  With the hip internally rotated, the short external rotators  were identified and removed from the posterior attachment from the greater trochanter. The piriformis was tagged for later repair. The capsule was identified and a T-shaped capsulotomy was performed. The capsule was tagged with #2 Tycron for later repair.  The femoral neck fracture was exposed, and the femoral head was removed using a corkscrew device. This was measured to be 43 mm in diameter. The attention was then turned to proximal femur preparation.  An oscillating saw was used to perform a proximal femoral osteotomy 1 fingerbreadth above the lesser trochanter.  The fracture was found to involve the medial calcar after the osteotomy and therefore a Youlanda Mighty cable was placed around the proximal femur to prevent fracture propagation distally during stem insertion.  The trial 43 mm femoral head was placed into the  acetabulum and had an excellent suction fit. The attention was then turned back to femoral preparation.    A femoral skid and Cobra retractor were placed under the femoral neck to allow for adequate visualization. A box osteotome was used to make the initial entry into the proximal femur. A single hand reamer was used to prepare the femoral canal. A T-shaped femoral canal sounder was then used to ensure no penetration femoral cortex had occurred during reaming. The proximal femur was then sequentially broached by hand. A size 3 femoral trial broach was found to have best medial to lateral canal fit. Once adequate mediolateral canal fill was achieved the trial femoral broach, neck, and head was assembled and the hip was reduced. It was found to have excellent stability, equivalent leg lengths with functional range of motion. The trial components were then removed.  I copiously irrigated the femoral canal and then impacted the real femoral prosthesis into place into the appropriate version, slightly anteverted to the normal anatomy, and I impacted the actual 43 mm Unitrax femoral component with a -4 neck adjustment sleeve into place on the trunion. The hip was then reduced and taken through functional range of motion and found to have excellent stability. Leg lengths were restored. The hip joint was copiously irrigated.   A soft tissue repair of the capsule and external rotators was performed using #2 Tycron.  An excellent posterior capsular repair was achieved through drill holes in the greater trochanter. The fascia lata was then closed with interrupted 0 Vicryl suture. The subcutaneous tissues were closed with 2-0 Vicryl and the skin approximated with staples.   The patient was then placed supine on the operative table. Leg lengths were checked clinically and found to be equivalent. An abduction pillow was placed between the lower extremities. The patient was then transferred to a hospital bed and brought  to the PACU in stable condition. I was scrubbed and present the entire case and all sharp and instrument counts were correct at the conclusion of the case. I spoke with the patient's family in the postop consultation room to let them know the case was completed without complication patient was stable in recovery room.   Timoteo Gaul, MD Orthopedic Surgeon

## 2017-03-04 NOTE — Progress Notes (Signed)
Echo shows normall LVEF, 70%, with normal wall motion. I do not see EKG in computer but one from 10/18 had no acute changes and monitor during echo had normal sinus rhythm, and may proceed with Surgery.

## 2017-03-04 NOTE — Progress Notes (Signed)
Family Meeting Note  Advance Directive:yes  Today a meeting took place with the Patient and Daughter and son in law.  Patient is unable to participate due ON:GEXBMWto:Lacked capacity Dementia   The following clinical team members were present during this meeting:MD  The following were discussed:Patient's diagnosis:   83 year old female with severe Alzheimer's dementia with hypothyroidism and now acute left femoral neck fracture.  Overall very poor prognosis.  Patient's progosis: < 12 months and Goals for treatment: DNR  Additional follow-up to be provided: Palliative care c/s -likely hospice if eligible  Time spent during discussion:20 minutes  Delfino LovettVipul Aubrey Blackard, MD

## 2017-03-04 NOTE — Progress Notes (Signed)
*  PRELIMINARY RESULTS* Echocardiogram 2D Echocardiogram has been performed.  Cristela BlueHege, Jasamine Pottinger 03/04/2017, 10:42 AM

## 2017-03-04 NOTE — Consult Note (Signed)
ORTHOPAEDIC CONSULTATION  REQUESTING PHYSICIAN: Delfino LovettShah, Vipul, MD  Chief Complaint: Left hip pain s/p fall.   HPI: Alejandra Armstrong is a 83 y.o. female who complains of left hip pain after a fall yesterday.  Patient has dementia and is unable to provide a history.  History is obtained by family at the bedside in the pre-op area.   Patient was diagnosed with a left displaced femoral neck hip fracture by xray upon arrival to [facility].    Past Medical History:  Diagnosis Date  . Alzheimer's dementia   . Arthritis   . Thyroid disease    hypothyroidism   Past Surgical History:  Procedure Laterality Date  . ABDOMINAL HYSTERECTOMY     Social History   Socioeconomic History  . Marital status: Widowed    Spouse name: None  . Number of children: None  . Years of education: None  . Highest education level: None  Social Needs  . Financial resource strain: None  . Food insecurity - worry: None  . Food insecurity - inability: None  . Transportation needs - medical: None  . Transportation needs - non-medical: None  Occupational History  . None  Tobacco Use  . Smoking status: Never Smoker  . Smokeless tobacco: Never Used  Substance and Sexual Activity  . Alcohol use: No  . Drug use: No  . Sexual activity: No  Other Topics Concern  . None  Social History Narrative  . None   Family History  Problem Relation Age of Onset  . Dementia Mother   . Depression Father   . Dementia Sister   . Colon cancer Sister    Allergies  Allergen Reactions  . Venlafaxine Hcl Other (See Comments)    Hallucinations, combative behavior   Prior to Admission medications   Medication Sig Start Date End Date Taking? Authorizing Provider  nitrofurantoin (MACRODANTIN) 100 MG capsule Take 100 mg by mouth at bedtime.   Yes [provider]  cephALEXin (KEFLEX) 500 MG capsule Take 1 capsule (500 mg total) by mouth every 12 (twelve) hours. 01/15/17   Enedina FinnerPatel, Sona, MD  Cholecalciferol (VITAMIN  D3) 2000 units TABS Take 2,000 Units by mouth daily.    [provider]  galantamine (RAZADYNE) 8 MG tablet Take 1 tablet (8 mg total) by mouth daily. Patient not taking: Reported on 03/11/2016 02/28/16   Maple HudsonGilbert, Richard L Jr., MD  levothyroxine (SYNTHROID, LEVOTHROID) 100 MCG tablet Take 100 mcg by mouth daily before breakfast.    [provider]  levothyroxine (SYNTHROID, LEVOTHROID) 25 MCG tablet Take 1 tablet (25 mcg total) by mouth daily before breakfast. Patient not taking: Reported on 03/11/2016 03/01/16   Maple HudsonGilbert, Richard L Jr., MD  memantine (NAMENDA) 10 MG tablet Take 10 mg by mouth 2 (two) times daily.    [provider]  risperiDONE (RISPERDAL) 0.5 MG tablet Take 0.75 mg by mouth at bedtime.    [provider]  sertraline (ZOLOFT) 50 MG tablet Take 75 mg by mouth daily.    [provider]  traZODone (DESYREL) 50 MG tablet Take 25 mg by mouth at bedtime.    [provider]   Dg Chest 1 View  Result Date: 03/03/2017 CLINICAL DATA:  Recent fall with known left hip fracture EXAM: CHEST 1 VIEW COMPARISON:  01/14/2017 FINDINGS: Cardiac shadow is within normal limits. Aortic calcifications are seen. The lungs are well aerated bilaterally. Degenerative changes of the thoracic spine are seen. No acute abnormality noted. IMPRESSION: No acute abnormality  noted. Electronically Signed   By: Alcide CleverMark  Lukens M.D.   On: 03/03/2017 18:59   Dg Hip Unilat W Or Wo Pelvis 2-3 Views Left  Result Date: 03/03/2017 CLINICAL DATA:  Fall 2 days ago with left hip pain, initial encounter EXAM: DG HIP (WITH OR WITHOUT PELVIS) 2-3V LEFT COMPARISON:  None. FINDINGS: The pelvic ring is intact. There is a subcapital femoral neck fracture on the left with impaction and angulation at the fracture site. Femoral head is well seated. A vague lucency is noted over the inferior pubic ramus on the left which may represent an undisplaced fracture. No other focal abnormality is  seen. IMPRESSION: Subcapital femoral neck fracture on the left. Suspicious lucency in the left inferior pubic ramus which may represent an undisplaced fracture. Electronically Signed   By: Alcide CleverMark  Lukens M.D.   On: 03/03/2017 18:58    Positive ROS: All other systems have been reviewed and were otherwise negative with the exception of those mentioned in the HPI and as above.  Physical Exam: General: Alert, no acute distress  MUSCULOSKELETAL: Left lower extremity:  Patient with slight shortening of the left lower extremity with slight external rotation.  Patient is to pedal pulses. She has spontaneous movement of her feet bilaterally. Could not determine sensation based on the patient's dementia.  Assessment: Left femoral neck hip fracture  Plan: I have recommended a left hip hemiarthroplasty. I described the details of this operation and the postoperative course to the family.  We also discussed the risks and benefits of the procedure.  They understand the risks include infection requiring removal of the prosthesis, bleeding requiring blood transfusion, nerve or blood vessel injury including injury to the sciatic nerve leading to foot drop, dislocation fracture, persistent hip pain, hardware failure and the need for further surgery. They also understand the risks include medical risks including myocardial infarction, DVT, pulmonary embolism, stroke, pneumonia, respiratory failure and death.  The family understands these risks and wishes to proceed with surgery. Consent was signed by the family. Patient has been cleared by the hospitalist service and by Dr.Khan in cardiology after cardiac workup this morning.  I reviewed laboratory studies and radiographic studies for this case.   Juanell FairlyKRASINSKI, Emre Stock, MD    03/04/2017 12:32 PM

## 2017-03-04 NOTE — Clinical Social Work Placement (Signed)
   CLINICAL SOCIAL WORK PLACEMENT  NOTE  Date:  03/04/2017  Patient Details  Name: Alejandra Armstrong MRN: [id number] Date of Birth: 02/07/1930  Clinical Social Work is seeking post-discharge placement for this patient at the Skilled  Nursing Facility level of care (*CSW will initial, date and re-position this form in  chart as items are completed):  Yes   Patient/family provided with [place] [facility] Clinical Social Work Department's list of facilities offering this level of care within the geographic area requested by the patient (or if unable, by the patient's family).  Yes   Patient/family informed of their freedom to choose among providers that offer the needed level of care, that participate in Medicare, Medicaid or managed care program needed by the patient, have an available bed and are willing to accept the patient.  Yes   Patient/family informed of [facility]'s ownership interest in [facility] and [facility], as well as of the fact that they are under no obligation to receive care at these facilities.  PASRR submitted to EDS on 03/04/17     PASRR number received on       Existing PASRR number confirmed on       FL2 transmitted to all facilities in geographic area requested by pt/family on 03/04/17     FL2 transmitted to all facilities within larger geographic area on       Patient informed that his/her managed care company has contracts with or will negotiate with certain facilities, including the following:            Patient/family informed of bed offers received.  Patient chooses bed at       Physician recommends and patient chooses bed at      Patient to be transferred to   on  .  Patient to be transferred to facility by       Patient family notified on   of transfer.  Name of family member notified:        PHYSICIAN       Additional Comment:    _______________________________________________ Scout Guyett, Darleen CrockerBailey M, LCSW 03/04/2017, 11:59 AM

## 2017-03-04 NOTE — Progress Notes (Signed)
Subjective:  POST-OP CHECK:  Patient is status post left hip hemiarthroplasty. Patient is confused but appears comfortable. Her family is at the bedside.  Objective:   VITALS:   Vitals:   03/04/17 1522 03/04/17 1537 03/04/17 1555 03/04/17 1628  BP: (!) 139/58 (!) 151/68 (!) 156/61 (!) 131/52  Pulse: 76 84 85 87  Resp: 15 15 17  (!) 2  Temp:   (!) 97.4 F (36.3 C) 98.7 F (37.1 C)  TempSrc:    Oral  SpO2: 100% 98% 97% 95%  Weight:      Height:        PHYSICAL EXAM: Left lower extremity: Intact pulses distally Dorsiflexion/Plantar flexion intact Incision: no drainage No cellulitis present Compartment soft  LABS  Results for orders placed or performed during the hospital encounter of 03/03/17 (from the past 24 hour(s))  CBC with Differential/Platelet     Status: Abnormal   Collection Time: 03/03/17  7:21 PM  Result Value Ref Range   WBC 12.7 (H) 3.6 - 11.0 K/uL   RBC 4.48 3.80 - 5.20 MIL/uL   Hemoglobin 11.8 (L) 12.0 - 16.0 g/dL   HCT 40.936.7 81.135.0 - 91.447.0 %   MCV 82.0 80.0 - 100.0 fL   MCH 26.4 26.0 - 34.0 pg   MCHC 32.2 32.0 - 36.0 g/dL   RDW 78.215.8 (H) 95.611.5 - 21.314.5 %   Platelets 257 150 - 440 K/uL   Neutrophils Relative % 89 %   Neutro Abs 11.3 (H) 1.4 - 6.5 K/uL   Lymphocytes Relative 5 %   Lymphs Abs 0.6 (L) 1.0 - 3.6 K/uL   Monocytes Relative 5 %   Monocytes Absolute 0.6 0.2 - 0.9 K/uL   Eosinophils Relative 1 %   Eosinophils Absolute 0.1 0 - 0.7 K/uL   Basophils Relative 0 %   Basophils Absolute 0.0 0 - 0.1 K/uL  Comprehensive metabolic panel     Status: Abnormal   Collection Time: 03/03/17  7:21 PM  Result Value Ref Range   Sodium 135 135 - 145 mmol/L   Potassium 4.5 3.5 - 5.1 mmol/L   Chloride 103 101 - 111 mmol/L   CO2 22 22 - 32 mmol/L   Glucose, Bld 210 (H) 65 - 99 mg/dL   BUN 48 (H) 6 - 20 mg/dL   Creatinine, Ser 0.861.03 (H) 0.44 - 1.00 mg/dL   Calcium 9.4 8.9 - 57.810.3 mg/dL   Total Protein 7.6 6.5 - 8.1 g/dL   Albumin 3.5 3.5 - 5.0 g/dL   AST 37 15 -  41 U/L   ALT 25 14 - 54 U/L   Alkaline Phosphatase 88 38 - 126 U/L   Total Bilirubin 0.5 0.3 - 1.2 mg/dL   GFR calc non Af Amer 48 (L) >60 mL/min   GFR calc Af Amer 56 (L) >60 mL/min   Anion gap 10 5 - 15  Troponin I     Status: None   Collection Time: 03/03/17  7:21 PM  Result Value Ref Range   Troponin I <0.03 <0.03 ng/mL  Urinalysis, Routine w reflex microscopic     Status: Abnormal   Collection Time: 03/03/17 10:27 PM  Result Value Ref Range   Color, Urine YELLOW (A) YELLOW   APPearance CLOUDY (A) CLEAR   Specific Gravity, Urine 1.024 1.005 - 1.030   pH 5.0 5.0 - 8.0   Glucose, UA NEGATIVE NEGATIVE mg/dL   Hgb urine dipstick NEGATIVE NEGATIVE   Bilirubin Urine NEGATIVE NEGATIVE   Ketones, ur NEGATIVE  NEGATIVE mg/dL   Protein, ur NEGATIVE NEGATIVE mg/dL   Nitrite NEGATIVE NEGATIVE   Leukocytes, UA NEGATIVE NEGATIVE  Surgical pcr screen     Status: None   Collection Time: 03/03/17 11:17 PM  Result Value Ref Range   MRSA, PCR NEGATIVE NEGATIVE   Staphylococcus aureus NEGATIVE NEGATIVE  Protime-INR     Status: None   Collection Time: 03/04/17  1:12 AM  Result Value Ref Range   Prothrombin Time 12.7 11.4 - 15.2 seconds   INR 0.96   APTT     Status: None   Collection Time: 03/04/17  1:12 AM  Result Value Ref Range   aPTT 32 24 - 36 seconds  TSH     Status: None   Collection Time: 03/04/17  1:12 AM  Result Value Ref Range   TSH 0.851 0.350 - 4.500 uIU/mL  Troponin I     Status: None   Collection Time: 03/04/17  1:12 AM  Result Value Ref Range   Troponin I <0.03 <0.03 ng/mL  Basic metabolic panel     Status: Abnormal   Collection Time: 03/04/17  1:12 AM  Result Value Ref Range   Sodium 134 (L) 135 - 145 mmol/L   Potassium 4.3 3.5 - 5.1 mmol/L   Chloride 101 101 - 111 mmol/L   CO2 23 22 - 32 mmol/L   Glucose, Bld 156 (H) 65 - 99 mg/dL   BUN 43 (H) 6 - 20 mg/dL   Creatinine, Ser 1.610.93 0.44 - 1.00 mg/dL   Calcium 9.2 8.9 - 09.610.3 mg/dL   GFR calc non Af Amer 54 (L) >60  mL/min   GFR calc Af Amer >60 >60 mL/min   Anion gap 10 5 - 15  CBC     Status: Abnormal   Collection Time: 03/04/17  1:12 AM  Result Value Ref Range   WBC 10.4 3.6 - 11.0 K/uL   RBC 4.04 3.80 - 5.20 MIL/uL   Hemoglobin 10.6 (L) 12.0 - 16.0 g/dL   HCT 04.532.2 (L) 40.935.0 - 81.147.0 %   MCV 79.6 (L) 80.0 - 100.0 fL   MCH 26.3 26.0 - 34.0 pg   MCHC 33.0 32.0 - 36.0 g/dL   RDW 91.415.3 (H) 78.211.5 - 95.614.5 %   Platelets 230 150 - 440 K/uL  Troponin I     Status: Abnormal   Collection Time: 03/04/17  7:12 AM  Result Value Ref Range   Troponin I 0.03 (HH) <0.03 ng/mL    Dg Chest 1 View  Result Date: 03/03/2017 CLINICAL DATA:  Recent fall with known left hip fracture EXAM: CHEST 1 VIEW COMPARISON:  01/14/2017 FINDINGS: Cardiac shadow is within normal limits. Aortic calcifications are seen. The lungs are well aerated bilaterally. Degenerative changes of the thoracic spine are seen. No acute abnormality noted. IMPRESSION: No acute abnormality noted. Electronically Signed   By: Alcide CleverMark  Lukens M.D.   On: 03/03/2017 18:59   Dg Hip Port Unilat With Pelvis 1v Left  Result Date: 03/04/2017 CLINICAL DATA:  Left hip replacement EXAM: DG HIP (WITH OR WITHOUT PELVIS) 1V PORT LEFT COMPARISON:  None. FINDINGS: Generalized osteopenia. Left hip arthroplasty without failure complication. Postsurgical changes in the surrounding soft tissues. No acute fracture or dislocation. IMPRESSION: Interval left hip arthroplasty. Electronically Signed   By: Elige KoHetal  Patel   On: 03/04/2017 15:55   Dg Hip Unilat W Or Wo Pelvis 2-3 Views Left  Result Date: 03/03/2017 CLINICAL DATA:  Fall 2 days ago with left hip pain, initial  encounter EXAM: DG HIP (WITH OR WITHOUT PELVIS) 2-3V LEFT COMPARISON:  None. FINDINGS: The pelvic ring is intact. There is a subcapital femoral neck fracture on the left with impaction and angulation at the fracture site. Femoral head is well seated. A vague lucency is noted over the inferior pubic ramus on the left which  may represent an undisplaced fracture. No other focal abnormality is seen. IMPRESSION: Subcapital femoral neck fracture on the left. Suspicious lucency in the left inferior pubic ramus which may represent an undisplaced fracture. Electronically Signed   By: Alcide Clever M.D.   On: 03/03/2017 18:58    Assessment/Plan: Day of Surgery   Active Problems:   Hip fx [facility])  Patient is stable postop. Labs will be checked in the morning. Foley catheter will be discontinued tomorrow morning. Patient will begin on Lovenox tomorrow for DVT prophylaxis. She will receive 24 hours postop antibiotics. Patient on fall precautions given her dementia.  She'll begin physical therapy tomorrow. I reviewed postoperative x-rays which show the prosthesis to be well-positioned. There is no evidence of fracture dislocation or other postoperative complication. The leg lengths appear equivalent.    Juanell Fairly , MD 03/04/2017, 5:05 PM

## 2017-03-04 NOTE — Anesthesia Preprocedure Evaluation (Signed)
Anesthesia Evaluation  Patient identified by MRN, date of birth, ID band Patient awake    Reviewed: Allergy & Precautions, H&P , NPO status , Patient's Chart, lab work & pertinent test results, reviewed documented beta blocker date and time   History of Anesthesia Complications Negative for: history of anesthetic complications  Airway Mallampati: II  TM Distance: >3 FB Neck ROM: full    Dental  (+) Dental Advidsory Given   Pulmonary neg pulmonary ROS,           Cardiovascular Exercise Tolerance: Good negative cardio ROS       Neuro/Psych PSYCHIATRIC DISORDERS (Dementia) negative neurological ROS     GI/Hepatic negative GI ROS, Neg liver ROS,   Endo/Other  neg diabetesHypothyroidism   Renal/GU negative Renal ROS  negative genitourinary   Musculoskeletal   Abdominal   Peds  Hematology negative hematology ROS (+)   Anesthesia Other Findings Past Medical History: No date: Alzheimer's dementia No date: Arthritis No date: Thyroid disease     Comment:  hypothyroidism   Reproductive/Obstetrics negative OB ROS                             Anesthesia Physical Anesthesia Plan  ASA: II  Anesthesia Plan: Spinal   Post-op Pain Management:    Induction: Intravenous  PONV Risk Score and Plan: 3 and Propofol infusion, Ondansetron and Dexamethasone  Airway Management Planned: Simple Face Mask  Additional Equipment:   Intra-op Plan:   Post-operative Plan:   Informed Consent: I have reviewed the patients History and Physical, chart, labs and discussed the procedure including the risks, benefits and alternatives for the proposed anesthesia with the patient or authorized representative who has indicated his/her understanding and acceptance.   Dental Advisory Given  Plan Discussed with: Anesthesiologist, CRNA and Surgeon  Anesthesia Plan Comments:         Anesthesia Quick  Evaluation

## 2017-03-04 NOTE — Progress Notes (Signed)
From Lab, patient's Tropinin level 0.03. MD Sherryll BurgerShah paged

## 2017-03-04 NOTE — Transfer of Care (Signed)
Immediate Anesthesia Transfer of Care Note  Patient: Alejandra Armstrong  Procedure(s) Performed: ARTHROPLASTY BIPOLAR HIP (HEMIARTHROPLASTY) (Left )  Patient Location: PACU  Anesthesia Type:Spinal  Level of Consciousness: awake  Airway & Oxygen Therapy: Patient Spontanous Breathing and Patient connected to face mask oxygen  Post-op Assessment: Report given to RN and Post -op Vital signs reviewed and stable  Post vital signs: Reviewed and stable  Last Vitals:  Vitals:   03/04/17 0801 03/04/17 1152  BP: (!) 125/43 (!) 127/49  Pulse: 94 92  Resp: 20 16  Temp: 36.8 C   SpO2: 94% 95%    Last Pain:  Vitals:   03/04/17 0801  TempSrc: Oral         Complications: No apparent anesthesia complications

## 2017-03-04 NOTE — NC FL2 (Addendum)
MEDICAID FL2 LEVEL OF CARE SCREENING TOOL     IDENTIFICATION  Patient Name: Alejandra Armstrong Birthdate: 11/19/1929 Sex: female Admission Date (Current Location): 03/03/2017  [facility] [place]  [place] ([id number]) Facility and Address:  [facility], [address], [place], [place] [address]      Provider Number: [id number]  Attending Physician Name and Address:  Delfino LovettShah, Vipul, MD  Relative Name and Phone Number:       Current Level of Care: Hospital Recommended Level of Care: Skilled Nursing Facility Prior Approval Number:    Date Approved/Denied:   PASRR Number:  [id number]   Discharge Plan: SNF    Current Diagnoses: Patient Active Problem List   Diagnosis Date Noted  . Hip fx (HCC) 03/03/2017  . Altered mental status 01/13/2017  . Late onset Alzheimer's disease without behavioral disturbance 04/03/2016  . Hypothyroidism 04/03/2016  . Mixed obsessional thoughts and acts 04/03/2016  . Arthritis 02/28/2016    Orientation RESPIRATION BLADDER Height & Weight     Self, Time  Normal Continent Weight: 108 lb (49 kg) Height:  5' (152.4 cm)  BEHAVIORAL SYMPTOMS/MOOD NEUROLOGICAL BOWEL NUTRITION STATUS      Continent Diet(NPO for surgery to be advanced. )  AMBULATORY STATUS COMMUNICATION OF NEEDS Skin   Extensive Assist Verbally Surgical wounds                       Personal Care Assistance Level of Assistance  Bathing, Feeding, Dressing Bathing Assistance: Limited assistance Feeding assistance: Independent Dressing Assistance: Limited assistance     Functional Limitations Info  Sight, Hearing, Speech Sight Info: Adequate Hearing Info: Adequate Speech Info: Adequate    SPECIAL CARE FACTORS FREQUENCY  PT (By licensed PT), OT (By licensed OT)     PT Frequency: (5) OT Frequency: (5)            Contractures      Additional Factors Info  Code Status, Allergies Code Status Info: (DNR ) Allergies  Info: (Venlafaxine Hcl)           Current Medications (03/04/2017):  This is the current hospital active medication list Current Facility-Administered Medications  Medication Dose Route Frequency Provider Last Rate Last Dose  . [MAR Hold] acetaminophen (TYLENOL) tablet 650 mg  650 mg Oral Q6H PRN Salary, Montell D, MD       Or  . Mitzi Hansen[MAR Hold] acetaminophen (TYLENOL) suppository 650 mg  650 mg Rectal Q6H PRN Salary, Evelena AsaMontell D, MD      . Mitzi Hansen[MAR Hold] calcium-vitamin D (OSCAL WITH D) 500-200 MG-UNIT per tablet 1 tablet  1 tablet Oral TID Angelina OkSalary, Montell D, MD   1 tablet at 03/03/17 2333  . [MAR Hold] cephALEXin (KEFLEX) capsule 500 mg  500 mg Oral Q12H Salary, Montell D, MD   500 mg at 03/03/17 2333  . dextrose 5 % in lactated ringers infusion   Intravenous Continuous Angelina OkSalary, Montell D, MD 80 mL/hr at 03/03/17 2329    . [MAR Hold] docusate sodium (COLACE) capsule 100 mg  100 mg Oral BID Angelina OkSalary, Montell D, MD   100 mg at 03/03/17 2336  . [MAR Hold] fentaNYL (SUBLIMAZE) injection 12.5 mcg  12.5 mcg Intravenous Q1H PRN Willy Eddyobinson, Patrick, MD   12.5 mcg at 03/03/17 1954  . [MAR Hold] HYDROcodone-acetaminophen (NORCO/VICODIN) 5-325 MG per tablet 1-2 tablet  1-2 tablet Oral Q4H PRN Salary, Evelena AsaMontell D, MD   1 tablet at 03/04/17 0557  . [MAR Hold] morphine 2  MG/ML injection 2 mg  2 mg Intravenous Q2H PRN Salary, Evelena AsaMontell D, MD      . Mitzi Hansen[MAR Hold] multivitamin with minerals tablet 1 tablet  1 tablet Oral Daily Salary, Montell D, MD      . Mitzi Hansen[MAR Hold] ondansetron (ZOFRAN) tablet 4 mg  4 mg Oral Q6H PRN Salary, Evelena AsaMontell D, MD       Or  . Mitzi Hansen[MAR Hold] ondansetron (ZOFRAN) injection 4 mg  4 mg Intravenous Q6H PRN Salary, Montell D, MD      . Mitzi Hansen[MAR Hold] polyethylene glycol (MIRALAX / GLYCOLAX) packet 17 g  17 g Oral Daily PRN Salary, Montell D, MD      . Mitzi Hansen[MAR Hold] sodium chloride flush (NS) 0.9 % injection 3 mL  3 mL Intravenous Q12H Salary, Montell D, MD   3 mL at 03/03/17 2334     Discharge Medications: Please see  discharge summary for a list of discharge medications.  Relevant Imaging Results:  Relevant Lab Results:   Additional Information (SSN: [id number])  Tateanna Bach, Darleen CrockerBailey M, LCSW

## 2017-03-04 NOTE — Progress Notes (Signed)
1        [facility] [place] [facility]   PATIENT NAME: Alejandra Armstrong    MR#:  [id number]  DATE OF BIRTH:  12/24/1929  SUBJECTIVE:  CHIEF COMPLAINT:   Chief Complaint  Patient presents with  . Fall  . Hip Pain  Pleasantly confused, family at bedside REVIEW OF SYSTEMS:  Review of Systems  Unable to perform ROS: Dementia    DRUG ALLERGIES:   Allergies  Allergen Reactions  . Venlafaxine Hcl Other (See Comments)    Hallucinations, combative behavior   VITALS:  Blood pressure (!) 131/52, pulse 87, temperature 98.7 F (37.1 C), temperature source Oral, resp. rate (!) 2, height 5' (1.524 m), weight 49 kg (108 lb), SpO2 95 %. PHYSICAL EXAMINATION:  Physical Exam  Constitutional: She is oriented to person, place, and time and well-developed, well-nourished, and in no distress.  HENT:  Head: Normocephalic and atraumatic.  Eyes: Conjunctivae and EOM are normal. Pupils are equal, round, and reactive to light.  Neck: Normal range of motion. Neck supple. No tracheal deviation present. No thyromegaly present.  Cardiovascular: Normal rate, regular rhythm and normal heart sounds.  Pulmonary/Chest: Effort normal and breath sounds normal. No respiratory distress. She has no wheezes. She exhibits no tenderness.  Abdominal: Soft. Bowel sounds are normal. She exhibits no distension. There is no tenderness.  Musculoskeletal:       Left hip: She exhibits decreased range of motion, decreased strength and tenderness.  Neurological: She is alert and oriented to person, place, and time. No cranial nerve deficit.  Skin: Skin is warm and dry. No rash noted.  Psychiatric: Mood and affect normal.   LABORATORY PANEL:  Female CBC Recent Labs  Lab 03/04/17 0112  WBC 10.4  HGB 10.6*  HCT 32.2*  PLT 230   ------------------------------------------------------------------------------------------------------------------ Chemistries  Recent Labs  Lab 03/03/17 1921  03/04/17 0112  NA 135 134*  K 4.5 4.3  CL 103 101  CO2 22 23  GLUCOSE 210* 156*  BUN 48* 43*  CREATININE 1.03* 0.93  CALCIUM 9.4 9.2  AST 37  --   ALT 25  --   ALKPHOS 88  --   BILITOT 0.5  --    RADIOLOGY:  Dg Chest 1 View  Result Date: 03/03/2017 CLINICAL DATA:  Recent fall with known left hip fracture EXAM: CHEST 1 VIEW COMPARISON:  01/14/2017 FINDINGS: Cardiac shadow is within normal limits. Aortic calcifications are seen. The lungs are well aerated bilaterally. Degenerative changes of the thoracic spine are seen. No acute abnormality noted. IMPRESSION: No acute abnormality noted. Electronically Signed   By: Alcide CleverMark  Lukens M.D.   On: 03/03/2017 18:59   Dg Hip Port Unilat With Pelvis 1v Left  Result Date: 03/04/2017 CLINICAL DATA:  Left hip replacement EXAM: DG HIP (WITH OR WITHOUT PELVIS) 1V PORT LEFT COMPARISON:  None. FINDINGS: Generalized osteopenia. Left hip arthroplasty without failure complication. Postsurgical changes in the surrounding soft tissues. No acute fracture or dislocation. IMPRESSION: Interval left hip arthroplasty. Electronically Signed   By: Elige KoHetal  Patel   On: 03/04/2017 15:55   Dg Hip Unilat W Or Wo Pelvis 2-3 Views Left  Result Date: 03/03/2017 CLINICAL DATA:  Fall 2 days ago with left hip pain, initial encounter EXAM: DG HIP (WITH OR WITHOUT PELVIS) 2-3V LEFT COMPARISON:  None. FINDINGS: The pelvic ring is intact. There is a subcapital femoral neck fracture on the left with impaction and angulation at the fracture site. Femoral head is well seated.  A vague lucency is noted over the inferior pubic ramus on the left which may represent an undisplaced fracture. No other focal abnormality is seen. IMPRESSION: Subcapital femoral neck fracture on the left. Suspicious lucency in the left inferior pubic ramus which may represent an undisplaced fracture. Electronically Signed   By: Alcide CleverMark  Lukens M.D.   On: 03/03/2017 18:58   ASSESSMENT AND PLAN:   1 acute left femoral  neck fracture status post left hip hemiarthroplasty -Pain management and DVT prophylaxis per orthopedics  2 chronic Alzheimer's disease - stable, mental status likely worsen post-op  3 chronic hypothyroidism -Continue levothyroxine  4 anemia of chronic disease -Monitor hemoglobin  5 acute on chronic osteoarthritis Stable     Overall poor prognosis, palliative care consultation.  Family interested in hospice    All the records are reviewed and case discussed with Care Management/Social Worker. Management plans discussed with the patient, family and they are in agreement.  CODE STATUS: DNR  TOTAL TIME TAKING CARE OF THIS PATIENT: 35 minutes.   More than 50% of the time was spent in counseling/coordination of care: YES  POSSIBLE D/C IN 2-3 DAYS, DEPENDING ON CLINICAL CONDITION.   Delfino LovettVipul Ozzy Bohlken M.D on 03/04/2017 at 5:15 PM  Between 7am to 6pm - Pager - [phone]  After 6pm go to [email or web address] - password [email or web address] [facility]  [facility]  Office  [phone]  CC: Primary care physician; Maple HudsonGilbert, Richard L Jr., MD  Note: This dictation was prepared with Dragon dictation along with smaller phrase technology. Any transcriptional errors that result from this process are unintentional.

## 2017-03-05 ENCOUNTER — Encounter: Payer: Self-pay | Admitting: Orthopedic Surgery

## 2017-03-05 ENCOUNTER — Inpatient Hospital Stay: Payer: Medicare Other

## 2017-03-05 DIAGNOSIS — M25552 Pain in left hip: Secondary | ICD-10-CM

## 2017-03-05 DIAGNOSIS — Z515 Encounter for palliative care: Secondary | ICD-10-CM

## 2017-03-05 DIAGNOSIS — S72002A Fracture of unspecified part of neck of left femur, initial encounter for closed fracture: Secondary | ICD-10-CM

## 2017-03-05 DIAGNOSIS — Z7189 Other specified counseling: Secondary | ICD-10-CM

## 2017-03-05 LAB — CBC
HEMATOCRIT: 32.2 % — AB (ref 35.0–47.0)
HEMOGLOBIN: 10.6 g/dL — AB (ref 12.0–16.0)
MCH: 26.6 pg (ref 26.0–34.0)
MCHC: 32.8 g/dL (ref 32.0–36.0)
MCV: 81 fL (ref 80.0–100.0)
Platelets: 213 10*3/uL (ref 150–440)
RBC: 3.97 MIL/uL (ref 3.80–5.20)
RDW: 15.5 % — ABNORMAL HIGH (ref 11.5–14.5)
WBC: 9.4 10*3/uL (ref 3.6–11.0)

## 2017-03-05 LAB — BLOOD GAS, ARTERIAL
Acid-base deficit: 0.4 mmol/L (ref 0.0–2.0)
Bicarbonate: 21.5 mmol/L (ref 20.0–28.0)
FIO2: 0.21
O2 Saturation: 94.2 %
PATIENT TEMPERATURE: 37
PCO2 ART: 27 mmHg — AB (ref 32.0–48.0)
PO2 ART: 64 mmHg — AB (ref 83.0–108.0)
pH, Arterial: 7.51 — ABNORMAL HIGH (ref 7.350–7.450)

## 2017-03-05 LAB — BASIC METABOLIC PANEL
ANION GAP: 6 (ref 5–15)
BUN: 17 mg/dL (ref 6–20)
CO2: 23 mmol/L (ref 22–32)
Calcium: 8.5 mg/dL — ABNORMAL LOW (ref 8.9–10.3)
Chloride: 104 mmol/L (ref 101–111)
Creatinine, Ser: 0.77 mg/dL (ref 0.44–1.00)
GFR calc Af Amer: >60 mL/min (ref >60)
GFR calc non Af Amer: >60 mL/min (ref >60)
GLUCOSE: 112 mg/dL — AB (ref 65–99)
POTASSIUM: 4.3 mmol/L (ref 3.5–5.1)
Sodium: 133 mmol/L — ABNORMAL LOW (ref 135–145)

## 2017-03-05 MED ORDER — HALOPERIDOL LACTATE 2 MG/ML PO CONC
0.5000 mg | ORAL | Status: DC | PRN
  Administered 2017-03-05: 0.5 mg via SUBLINGUAL
  Filled 2017-03-05 (×2): qty 0.3

## 2017-03-05 MED ORDER — SODIUM CHLORIDE 0.9% FLUSH
3.0000 mL | Freq: Two times a day (BID) | INTRAVENOUS | Status: DC
  Administered 2017-03-05 – 2017-03-06 (×2): 3 mL via INTRAVENOUS

## 2017-03-05 MED ORDER — SODIUM CHLORIDE 0.9% FLUSH: 3.0000 mL | INTRAVENOUS | Status: DC | PRN

## 2017-03-05 MED ORDER — GLYCOPYRROLATE 0.2 MG/ML IJ SOLN
0.2000 mg | INTRAMUSCULAR | Status: DC | PRN
  Filled 2017-03-05: qty 1

## 2017-03-05 MED ORDER — CEFTRIAXONE SODIUM 1 G IJ SOLR
1.0000 g | INTRAMUSCULAR | Status: DC
  Administered 2017-03-05: 1 g via INTRAVENOUS
  Filled 2017-03-05: qty 10

## 2017-03-05 MED ORDER — BIOTENE DRY MOUTH MT LIQD: 15.0000 mL | OROMUCOSAL | Status: DC | PRN

## 2017-03-05 MED ORDER — ENSURE ENLIVE PO LIQD: 237.0000 mL | Freq: Two times a day (BID) | ORAL | Status: DC

## 2017-03-05 MED ORDER — ENOXAPARIN SODIUM 40 MG/0.4ML ~~LOC~~ SOLN: 40.0000 mg | SUBCUTANEOUS | Status: DC

## 2017-03-05 MED ORDER — HALOPERIDOL LACTATE 5 MG/ML IJ SOLN: 0.5000 mg | INTRAMUSCULAR | Status: DC | PRN

## 2017-03-05 MED ORDER — POLYVINYL ALCOHOL 1.4 % OP SOLN
1.0000 [drp] | Freq: Four times a day (QID) | OPHTHALMIC | Status: DC | PRN
  Filled 2017-03-05: qty 15

## 2017-03-05 MED ORDER — SODIUM CHLORIDE 0.9 % IV SOLN: 250.0000 mL | INTRAVENOUS | Status: DC | PRN

## 2017-03-05 MED ORDER — GLYCOPYRROLATE 1 MG PO TABS
1.0000 mg | ORAL_TABLET | ORAL | Status: DC | PRN
  Filled 2017-03-05: qty 1

## 2017-03-05 MED ORDER — HALOPERIDOL 0.5 MG PO TABS
0.5000 mg | ORAL_TABLET | ORAL | Status: DC | PRN
  Filled 2017-03-05: qty 1

## 2017-03-05 NOTE — Progress Notes (Signed)
Initial Nutrition Assessment  DOCUMENTATION CODES:   Severe malnutrition in context of chronic illness  INTERVENTION:   Recommend monitor P and Mg labs  Ensure Enlive po BID, each supplement provides 350 kcal and 20 grams of protein  Magic cup TID with meals, each supplement provides 290 kcal and 9 grams of protein  MVI daily   Assist with feeding   NUTRITION DIAGNOSIS:   Severe Malnutrition related to chronic illness(dementia and advacned age ) as evidenced by mild fat depletion, moderate to severe muscle depletions, 5 percent weight loss in 1 month.  GOAL:   Patient will meet greater than or equal to 90% of their needs  MONITOR:   PO intake, Supplement acceptance, Labs, Weight trends, I & O's, Skin  REASON FOR ASSESSMENT:   Malnutrition Screening Tool    ASSESSMENT:   83 year old female with severe Alzheimer's dementia with hypothyroidism and now acute left femoral neck fracture. Now s/p left hip hemiarthroplasty   Visited pt's room today. Unable to speak with pt r/t dementia so history obtained from pt's family at bedside. Per pt's family, pt has had a slow decline in her appetite and oral intake over the past year. Family reports that pt will just eat bites of meals but pt will drink Ensure. Pt requires assistance with feeding at baseline. Per chart, pt with 6lb(5%) wt loss over the past month; this is significant. Pt has not been awake since surgery in order to attempt to eat. RD will order Ensure and Magic Cups for when pt more alert. Per MD note, pt with poor prognosis. Palliatvie consult pending. Pt is at high refeeding risk; recommend monitor P and Mg.   Medications reviewed and include: Oscal, colace, lovenox, ferrous sulfate, synthroid, MVI, senokot, NaCl @ 4575ml/hr, ceftriaxone, miralax   Labs reviewed: Na 133(L), Ca 8.5(L) Hgb 10.6(L), Hct 32.2(L) cbgs- 210, 156, 112 x 48 hrs  Nutrition-Focused physical exam completed. Findings are mild fat depletions in  cheeks, moderate muscle depletions in clavicles, temporal regions, and shoulders, severe muscle depletions in hands and BLE, and no edema.   Diet Order:  Diet regular Room service appropriate? Yes; Fluid consistency: Thin  EDUCATION NEEDS:   Not appropriate for education at this time  Skin:  Skin Assessment: (Incision hip )  Last BM:  PTA  Height:   Ht Readings from Last 1 Encounters:  03/04/17 5' (1.524 m)    Weight:   Wt Readings from Last 1 Encounters:  03/04/17 108 lb (49 kg)    Ideal Body Weight:  45.4 kg  BMI:  Body mass index is 21.09 kg/m.  Estimated Nutritional Needs:   Kcal:  1200-1400kcal/day   Protein:  73-83g/day   Fluid:  >1.4L/day   Betsey Holidayasey Itzae Miralles MS, RD, LDN Pager #[phone] After Hours Pager: [phone]

## 2017-03-05 NOTE — Progress Notes (Signed)
Subjective:  POD #1 s/p left hip hemiarthroplasty.  Patient is sleeping comfortably. She is unable to provide a history given her severe dementia.  Objective:   VITALS:   Vitals:   03/05/17 0410 03/05/17 0849 03/05/17 1018 03/05/17 1258  BP: (!) 115/44 (!) 140/95 (!) 108/48 (!) 123/44  Pulse: (!) 106 (!) 110 (!) 108 85  Resp: 16 17 20 20   Temp: 98.5 F (36.9 C) 100.3 F (37.9 C) 100 F (37.8 C) 99.4 F (37.4 C)  TempSrc: Oral Axillary Oral Oral  SpO2: 95% 93% 93% (!) 84%  Weight:      Height:        PHYSICAL EXAM: Left lower extremity: Patient's dressing is clean dry and intact. She has palpable pedal pulses and has spontaneous plantar flexion and dorsiflexion to her toes and ankle.   LABS  Results for orders placed or performed during the hospital encounter of 03/03/17 (from the past 24 hour(s))  CBC     Status: Abnormal   Collection Time: 03/05/17  5:45 AM  Result Value Ref Range   WBC 9.4 3.6 - 11.0 K/uL   RBC 3.97 3.80 - 5.20 MIL/uL   Hemoglobin 10.6 (L) 12.0 - 16.0 g/dL   HCT 16.132.2 (L) 09.635.0 - 04.547.0 %   MCV 81.0 80.0 - 100.0 fL   MCH 26.6 26.0 - 34.0 pg   MCHC 32.8 32.0 - 36.0 g/dL   RDW 40.915.5 (H) 81.111.5 - 91.414.5 %   Platelets 213 150 - 440 K/uL  Basic metabolic panel     Status: Abnormal   Collection Time: 03/05/17  5:45 AM  Result Value Ref Range   Sodium 133 (L) 135 - 145 mmol/L   Potassium 4.3 3.5 - 5.1 mmol/L   Chloride 104 101 - 111 mmol/L   CO2 23 22 - 32 mmol/L   Glucose, Bld 112 (H) 65 - 99 mg/dL   BUN 17 6 - 20 mg/dL   Creatinine, Ser 7.820.77 0.44 - 1.00 mg/dL   Calcium 8.5 (L) 8.9 - 10.3 mg/dL   GFR calc non Af Amer >60 >60 mL/min   GFR calc Af Amer >60 >60 mL/min   Anion gap 6 5 - 15  Blood gas, arterial     Status: Abnormal   Collection Time: 03/05/17 10:30 AM  Result Value Ref Range   FIO2 0.21    pH, Arterial 7.51 (H) 7.350 - 7.450   pCO2 arterial 27 (L) 32.0 - 48.0 mmHg   pO2, Arterial 64 (L) 83.0 - 108.0 mmHg   Bicarbonate 21.5 20.0 - 28.0  mmol/L   Acid-base deficit 0.4 0.0 - 2.0 mmol/L   O2 Saturation 94.2 %   Patient temperature 37.0    Collection site LEFT RADIAL    Sample type ARTERIAL DRAW    Allens test (pass/fail) PASS PASS    Dg Chest 1 View  Result Date: 03/05/2017 CLINICAL DATA:  Fever EXAM: CHEST 1 VIEW COMPARISON:  03/03/2017 FINDINGS: Low lung volumes. Bibasilar atelectasis. Heart is normal size. No effusions. Aorta is calcified, nonaneurysmal. IMPRESSION: Low lung volumes, bibasilar atelectasis. Electronically Signed   By: Charlett NoseKevin  Dover M.D.   On: 03/05/2017 10:37   Dg Chest 1 View  Result Date: 03/03/2017 CLINICAL DATA:  Recent fall with known left hip fracture EXAM: CHEST 1 VIEW COMPARISON:  01/14/2017 FINDINGS: Cardiac shadow is within normal limits. Aortic calcifications are seen. The lungs are well aerated bilaterally. Degenerative changes of the thoracic spine are seen. No acute abnormality noted. IMPRESSION:  No acute abnormality noted. Electronically Signed   By: Alcide CleverMark  Lukens M.D.   On: 03/03/2017 18:59   Ct Head Wo Contrast  Result Date: 03/05/2017 CLINICAL DATA:  Altered level of consciousness.  Underlying dementia EXAM: CT HEAD WITHOUT CONTRAST TECHNIQUE: Contiguous axial images were obtained from the base of the skull through the vertex without intravenous contrast. COMPARISON:  February 01, 2017 FINDINGS: Brain: There is stable moderate diffuse atrophy. There is no intracranial mass, hemorrhage, extra-axial fluid collection, midline shift. There is small vessel disease scattered throughout the centra semiovale bilaterally. There is small vessel disease throughout the left internal capsule and to a lesser extent in the right internal capsule. There is no appreciable acute infarct. Vascular: There is no hyperdense vessel. There is calcification in each carotid siphon region and each distal vertebral artery. Skull: Bony calvarium appears intact. Sinuses/Orbits: Visualized paranasal sinuses are clear.  Visualized orbits appear symmetric bilaterally. Other: Visualized mastoid air cells are clear. IMPRESSION: Stable atrophy with extensive small vessel disease. No acute infarct evident. No mass or hemorrhage. There are foci of arterial vascular calcification at multiple sites. Electronically Signed   By: Bretta BangWilliam  Woodruff III M.D.   On: 03/05/2017 12:19   Dg Hip Port Unilat With Pelvis 1v Left  Result Date: 03/04/2017 CLINICAL DATA:  Left hip replacement EXAM: DG HIP (WITH OR WITHOUT PELVIS) 1V PORT LEFT COMPARISON:  None. FINDINGS: Generalized osteopenia. Left hip arthroplasty without failure complication. Postsurgical changes in the surrounding soft tissues. No acute fracture or dislocation. IMPRESSION: Interval left hip arthroplasty. Electronically Signed   By: Elige KoHetal  Patel   On: 03/04/2017 15:55   Dg Hip Unilat W Or Wo Pelvis 2-3 Views Left  Result Date: 03/03/2017 CLINICAL DATA:  Fall 2 days ago with left hip pain, initial encounter EXAM: DG HIP (WITH OR WITHOUT PELVIS) 2-3V LEFT COMPARISON:  None. FINDINGS: The pelvic ring is intact. There is a subcapital femoral neck fracture on the left with impaction and angulation at the fracture site. Femoral head is well seated. A vague lucency is noted over the inferior pubic ramus on the left which may represent an undisplaced fracture. No other focal abnormality is seen. IMPRESSION: Subcapital femoral neck fracture on the left. Suspicious lucency in the left inferior pubic ramus which may represent an undisplaced fracture. Electronically Signed   By: Alcide CleverMark  Lukens M.D.   On: 03/03/2017 18:58    Assessment/Plan: 1 Day Post-Op   Active Problems:   Hip fx (HCC)  Patient is stable postop. I reviewed her vital signs and labs. Patient has received 24 hours of postop antibiotics.  Continue therapy as patient can tolerate.  Patient will likely need skilled nursing facility upon discharge.    Juanell FairlyKRASINSKI, Anias Bartol , MD 03/05/2017, 1:05 PM

## 2017-03-05 NOTE — Progress Notes (Signed)
ANTIBIOTIC CONSULT NOTE - INITIAL  Pharmacy Consult for ceftriaxone Indication: UTI  Allergies  Allergen Reactions  . Venlafaxine Hcl Other (See Comments)    Hallucinations, combative behavior    Patient Measurements: Height: 5' (152.4 cm) Weight: 108 lb (49 kg) IBW/kg (Calculated) : 45.5 Adjusted Body Weight:   Vital Signs: Temp: 100 F (37.8 C) (12/12 1018) Temp Source: Oral (12/12 1018) BP: 108/48 (12/12 1018) Pulse Rate: 108 (12/12 1018) Intake/Output from previous day: 12/11 0701 - 12/12 0700 In: 3187.5 [P.O.:300; I.V.:2887.5] Out: 1000 [Urine:900; Blood:100] Intake/Output from this shift: Total I/O In: 0  Out: 175 [Urine:175]  Labs: Recent Labs    03/03/17 1921 03/04/17 0112 03/05/17 0545  WBC 12.7* 10.4 9.4  HGB 11.8* 10.6* 10.6*  PLT 257 230 213  CREATININE 1.03* 0.93 0.77   Estimated Creatinine Clearance: 36.9 mL/min (by C-G formula based on SCr of 0.77 mg/dL). No results for input(s): VANCOTROUGH, VANCOPEAK, VANCORANDOM, GENTTROUGH, GENTPEAK, GENTRANDOM, TOBRATROUGH, TOBRAPEAK, TOBRARND, AMIKACINPEAK, AMIKACINTROU, AMIKACIN in the last 72 hours.   Microbiology: Recent Results (from the past 720 hour(s))  Surgical pcr screen     Status: None   Collection Time: 03/03/17 11:17 PM  Result Value Ref Range Status   MRSA, PCR NEGATIVE NEGATIVE Final   Staphylococcus aureus NEGATIVE NEGATIVE Final    Comment: (NOTE) The Xpert SA Assay (FDA approved for NASAL specimens in patients 81 years of age and older), is one component of a comprehensive surveillance program. It is not intended to diagnose infection nor to guide or monitor treatment.     Medical History: Past Medical History:  Diagnosis Date  . Alzheimer's dementia   . Arthritis   . Thyroid disease    hypothyroidism    Medications:  Infusions:  . sodium chloride 75 mL/hr (03/05/17 0848)  . cefTRIAXone (ROCEPHIN)  IV    . methocarbamol (ROBAXIN)  IV     Assessment: 87 yof with  unremarkable UA and Bactrim PTA as well as Macrodantin for UTI prophylaxis. Pharmacy consulted to dose ceftriaxone for UTI. Bactrim and Macrodantin discontinued.   Goal of Therapy:    Plan:  Ceftriaxone 1 gm IV Q24H. Will f/u repeat UA.  Carola FrostNathan A Saori Umholtz, Pharm.D., BCPS Clinical Pharmacist 03/05/2017,10:50 AM

## 2017-03-05 NOTE — Progress Notes (Signed)
SUBJECTIVE: Pt is alert, but unable to verbalize responses to questions.    Vitals:   03/04/17 1857 03/04/17 1954 03/05/17 0031 03/05/17 0410  BP: (!) 134/45 (!) 140/43 (!) 129/53 (!) 115/44  Pulse: 98 98 (!) 102 (!) 106  Resp:  18 15 16   Temp:  97.8 F (36.6 C) 99.5 F (37.5 C) 98.5 F (36.9 C)  TempSrc:   Oral Oral  SpO2: 100% 98% 93% 95%  Weight:      Height:        Intake/Output Summary (Last 24 hours) at 03/05/2017 0842 Last data filed at 03/05/2017 0410 Gross per 24 hour  Intake 3187.5 ml  Output 1000 ml  Net 2187.5 ml    LABS: Basic Metabolic Panel: Recent Labs    03/04/17 0112 03/05/17 0545  NA 134* 133*  K 4.3 4.3  CL 101 104  CO2 23 23  GLUCOSE 156* 112*  BUN 43* 17  CREATININE 0.93 0.77  CALCIUM 9.2 8.5*   Liver Function Tests: Recent Labs    03/03/17 1921  AST 37  ALT 25  ALKPHOS 88  BILITOT 0.5  PROT 7.6  ALBUMIN 3.5   No results for input(s): LIPASE, AMYLASE in the last 72 hours. CBC: Recent Labs    03/03/17 1921 03/04/17 0112 03/05/17 0545  WBC 12.7* 10.4 9.4  NEUTROABS 11.3*  --   --   HGB 11.8* 10.6* 10.6*  HCT 36.7 32.2* 32.2*  MCV 82.0 79.6* 81.0  PLT 257 230 213   Cardiac Enzymes: Recent Labs    03/03/17 1921 03/04/17 0112 03/04/17 0712  TROPONINI <0.03 <0.03 0.03*   BNP: Invalid input(s): POCBNP D-Dimer: No results for input(s): DDIMER in the last 72 hours. Hemoglobin A1C: No results for input(s): HGBA1C in the last 72 hours. Fasting Lipid Panel: No results for input(s): CHOL, HDL, LDLCALC, TRIG, CHOLHDL, LDLDIRECT in the last 72 hours. Thyroid Function Tests: Recent Labs    03/04/17 0112  TSH 0.851   Anemia Panel: No results for input(s): VITAMINB12, FOLATE, FERRITIN, TIBC, IRON, RETICCTPCT in the last 72 hours.   PHYSICAL EXAM General: Trembling, appears to be in pain.  HEENT:  Normocephalic and atramatic Neck:  No JVD.  Lungs: Clear bilaterally to auscultation and percussion. Heart: Mild  tachycardia.  Abdomen: Bowel sounds are positive, abdomen soft and non-tender  Msk:  Back normal, normal gait. Normal strength and tone for age. Extremities: No clubbing, cyanosis or edema.   Neuro: Alert, not oriented. Psych:  Alert but not responding to questions.    ASSESSMENT AND PLAN: One day status post hip fracture repair. Echo showed normal LVEF. Mild tachycardia by ausculation with mild hypertension, likely due to post-operative pain.  Continue to monitor and advise metoprolol if she continues to exhibit tachycardia.    Active Problems:   Hip fx (HCC)    Caroleen HammanKristin Haevyn Ury, NP-C 03/05/2017 8:42 AM [phone]

## 2017-03-05 NOTE — Progress Notes (Signed)
New hospice home referral received from [facility] following a Palliative Medicine consult. Patient is an 83 year old woman with a  Known history of dementia, hypothyroidism and arthritis admitted to [facility] on 12/10 from [facility], following a fall. Xray in the ED revealed a left femoral neck fracture which was repaired on 12/11. Per chart note review patient has had multiple falls at her facility, has had poor oral intake along with pocketing food. Post op she is not eating or drinking, Palliative medicine was consulted for goals of care and met with her daughter/HCPOA [name] Reys, who has chosen to focus on her comfort with transfer to the hospice home. Writer met in the room with [name], son in law Dominica Severin and grandson's to initiate education regarding hospice services, philosophy and team approach to care with understanding voiced. Questions answered, consents signed. Patient was lying in bed, eyes opened, mumbling, but incoherent. Per [name], her mother isn't speaking [place] or Spanish and is talking at times to her family members that have died. Per chart note review patient has received tylenol today for pain, but has also had low grade fever.  Plan is for transfer to the hospice home tomorrow via EMS with signed out of facility DNR in place. CSW Mel Almond made aware. Will continue to follow through final disposition. Flo Shanks RN, BSN, [facility], hospital Liaison [phone]

## 2017-03-05 NOTE — Progress Notes (Signed)
Chaplain received a consult for pt who requested for prayer. Durbin met pt and daughter. Pt is transitioning to "Comfort Care", seemed confused but responded to daughter's commands and instruction. Daughter talked about the pt's health challenges and added that pt was ready to die. CH provided comfort, prayer and a Prayer Shaw to pt. Parmer to follow up with pt and daughter as needed.    03/05/17 1600  Clinical Encounter Type  Visited With Patient;Patient and family together;Health care provider  Visit Type Initial;Follow-up;Spiritual support  Referral From Nurse  Consult/Referral To Chaplain  Spiritual Encounters  Spiritual Needs Prayer

## 2017-03-05 NOTE — Progress Notes (Addendum)
OT Cancellation Note  Patient Details Name: Leda GauzeLuz Maria Giddens MRN: [id number] DOB: 10/10/1929   Cancelled Treatment:    Reason Eval/Treat Not Completed: Pt. Too lethargic to participate, and has severe Dementia at baseline. Will continue to monitor, and intervene when appropriate.  Olegario MessierElaine Chasyn Cinque, MS, OTR/L 03/05/2017, 1:49 PM

## 2017-03-05 NOTE — Progress Notes (Signed)
Bladder scan at 1900 showed 220 cc. Per Morton Stallrystal Griffin, NP with palliative care, if bladder scan >300, can insert foley.   Dixie UnionHudson, Latricia HeftKorie G

## 2017-03-05 NOTE — Progress Notes (Signed)
Clinical Education officer, museum (CSW) received residential hospice placement consult. CSW met with patient's daughter Cornelius Moras and gave her hospice facility choice. She chose [facility] in [place]. [facility] liaison is aware of referral and will get in touch with [name].   [employer], LCSW [phone]

## 2017-03-05 NOTE — Progress Notes (Addendum)
Clinical Child psychotherapistocial Worker (CSW) presented bed offers to patient's daughter [name]. She chose [facility]. [facility] at [facility] is aware of accepted bed offer. PASARR is pending.   PASARR received, [phone] E expires on 04/04/2017.   Baker Hughes IncorporatedBailey Kayanna Mckillop, LCSW [phone]

## 2017-03-05 NOTE — Progress Notes (Signed)
1        [facility] [place] [facility]   PATIENT NAME: Alejandra CatchingsLuz Armstrong    MR#:  [id number]  DATE OF BIRTH:  02/23/1930  SUBJECTIVE: Patient is a very lethargic today.  She was communicating yesterday  As per CNAbut noted to have decline in mental mental function.  CHIEF COMPLAINT:   Chief Complaint  Patient presents with  . Fall  . Hip Pain   REVIEW OF SYSTEMS:  Review of Systems  Unable to perform ROS: Dementia     DRUG ALLERGIES:   Allergies  Allergen Reactions  . Venlafaxine Hcl Other (See Comments)    Hallucinations, combative behavior   VITALS:  Blood pressure (!) 140/95, pulse (!) 110, temperature 100.3 F (37.9 C), temperature source Axillary, resp. rate 17, height 5' (1.524 m), weight 49 kg (108 lb), SpO2 93 %. PHYSICAL EXAMINATION:  Physical Exam  Constitutional: She is well-developed, well-nourished, and in no distress.  HENT:  Head: Normocephalic and atraumatic.  Eyes: Conjunctivae and EOM are normal. Pupils are equal, round, and reactive to light.  Neck: Normal range of motion. Neck supple. No tracheal deviation present. No thyromegaly present.  Cardiovascular: Normal rate, regular rhythm and normal heart sounds.  Pulmonary/Chest: Effort normal and breath sounds normal. No respiratory distress. She has no wheezes. She exhibits no tenderness.  Abdominal: Soft. Bowel sounds are normal. She exhibits no distension. There is no tenderness.  Musculoskeletal:       Left hip: She exhibits decreased range of motion, decreased strength and tenderness.  Neurological: Facial asymmetry: gic. No cranial nerve deficit.  lethargic  Skin: Skin is warm and dry. No rash noted.  Psychiatric: Mood and affect normal.   LABORATORY PANEL:  Female CBC Recent Labs  Lab 03/05/17 0545  WBC 9.4  HGB 10.6*  HCT 32.2*  PLT 213   ------------------------------------------------------------------------------------------------------------------ Chemistries    Recent Labs  Lab 03/03/17 1921  03/05/17 0545  NA 135   < > 133*  K 4.5   < > 4.3  CL 103   < > 104  CO2 22   < > 23  GLUCOSE 210*   < > 112*  BUN 48*   < > 17  CREATININE 1.03*   < > 0.77  CALCIUM 9.4   < > 8.5*  AST 37  --   --   ALT 25  --   --   ALKPHOS 88  --   --   BILITOT 0.5  --   --    < > = values in this interval not displayed.   RADIOLOGY:  Dg Hip Port Unilat With Pelvis 1v Left  Result Date: 03/04/2017 CLINICAL DATA:  Left hip replacement EXAM: DG HIP (WITH OR WITHOUT PELVIS) 1V PORT LEFT COMPARISON:  None. FINDINGS: Generalized osteopenia. Left hip arthroplasty without failure complication. Postsurgical changes in the surrounding soft tissues. No acute fracture or dislocation. IMPRESSION: Interval left hip arthroplasty. Electronically Signed   By: Elige KoHetal  Patel   On: 03/04/2017 15:55   ASSESSMENT AND PLAN:   1 acute left femoral neck fracture status post left hip hemiarthroplasty -Pain management and DVT prophylaxis per orthopedics  2 chronic Alzheimer's disease; more lethargic today, patient did not get any narcotics, she is getting only Tylenol for pain control.  Has low-grade temperature, head CT without contrast, chest x-ray, repeat UA. Hold Remeron because of sedation and she is more lethargic, but also get ABG.  - 3 chronic hypothyroidism -Continue levothyroxine  4 anemia of chronic disease -Monitor hemoglobin  5 acute on chronic osteoarthritis Stable   6 history of UTI, patient admitted recently on October 22 and discharged with Keflex but unsure why she is on Bactrim and also Macrodantin this time.  I am going to stop Bactrim, continue Rocephin, repeat UA again.  Overall poor prognosis, palliative care consultation.  Family interested in hospice CODE STATUS DNR.   All the records are reviewed and case discussed with Care Management/Social Worker. Management plans discussed with the patient, family and they are in agreement.  CODE STATUS:  DNR  TOTAL TIME TAKING CARE OF THIS PATIENT: 35 minutes.   More than 50% of the time was spent in counseling/coordination of care: YES  POSSIBLE D/C IN 2-3 DAYS, DEPENDING ON CLINICAL CONDITION.   Katha HammingSnehalatha Arren Laminack M.D on 03/05/2017 at 10:12 AM  Between 7am to 6pm - Pager - [phone]  After 6pm go to [email or web address] - password [email or web address] [facility]  [facility]  Office  [phone]  CC: Primary care physician; Maple HudsonGilbert, Richard L Jr., MD  Note: This dictation was prepared with Dragon dictation along with smaller phrase technology. Any transcriptional errors that result from this process are unintentional.

## 2017-03-05 NOTE — Anesthesia Postprocedure Evaluation (Signed)
Anesthesia Post Note  Patient: Alejandra Armstrong  Procedure(s) Performed: ARTHROPLASTY BIPOLAR HIP (HEMIARTHROPLASTY) (Left )  Patient location during evaluation: Nursing Unit Anesthesia Type: Spinal Level of consciousness: confused and responds to stimulation (baseline dementia) Pain management: pain level controlled Vital Signs Assessment: post-procedure vital signs reviewed and stable Respiratory status: spontaneous breathing and respiratory function stable Cardiovascular status: blood pressure returned to baseline and stable Postop Assessment: no headache, no backache and no apparent nausea or vomiting Anesthetic complications: no Comments: Pt able to move feet and toes bilateral to verbal commands and physical stimulus.       Last Vitals:  Vitals:   03/05/17 0410 03/05/17 0849  BP: (!) 115/44 (!) 140/95  Pulse: (!) 106 (!) 110  Resp: 16 17  Temp: 36.9 C 37.9 C  SpO2: 95% 93%    Last Pain:  Vitals:   03/05/17 0849  TempSrc: Axillary  PainSc:                  Jules SchickLogan,  Karmina Zufall P

## 2017-03-05 NOTE — Care Management (Signed)
RNCM consult received for discharge planning. Following progression. PT consult pending. May be hospice appropriate.

## 2017-03-05 NOTE — Progress Notes (Signed)
OT Cancellation Note  Patient Details Name: Leda GauzeLuz Maria Sinyard MRN: [id number] DOB: 01/21/1930   Cancelled Treatment:    Reason Eval/Treat Not Completed: Medical issues which prohibited therapy(Pt. to have a head CT, and chest x-ray secondary to changes following surgery. Will continue to monitor, and eval when appropriate. )  Olegario MessierElaine Matheau [place]rona, MS, OTR/L 03/05/2017, 10:24 AM

## 2017-03-05 NOTE — Progress Notes (Signed)
PT Cancellation Note  Patient Details Name: Alejandra Armstrong MRN: [id number] DOB: 12/14/1929   Cancelled Treatment:    Reason Eval/Treat Not Completed: Other (comment).  Attempted to check on pt this afternoon but Palliative Care present in pt's room for a longer period of time (pt's daughter in room).  Nursing reports no change in status/pt's alertness (pt not following commands; not alert).  Will re-attempt PT eval tomorrow as appropriate.  Hendricks LimesEmily Michiah Mudry, PT 03/05/17, 3:18 PM [phone]

## 2017-03-05 NOTE — Progress Notes (Signed)
PT Cancellation Note  Patient Details Name: Leda GauzeLuz Maria Geisel MRN: [id number] DOB: 09/05/1929   Cancelled Treatment:    Reason Eval/Treat Not Completed: Other (comment).  PT consult received.  Chart reviewed.  Per discussion with nursing, will hold PT at this time (until head CT is performed and results are known).  Hendricks LimesEmily Rossi Burdo, PT 03/05/17, 10:20 AM [phone]

## 2017-03-05 NOTE — Consult Note (Signed)
Consultation Note Date: 03/05/2017   Patient Name: Alejandra Armstrong  DOB: 1930-03-22  MRN: [id number]  Age / Sex: 83 y.o., female  PCP: Maple Hudson., MD Referring Physician: Katha Hamming, MD  Reason for Consultation: Establishing goals of care  HPI/Patient Profile: Alejandra Armstrong  is a 83 y.o. female with a known history of dementia presented to the emergency room with second fall in the last 2 days, also fell 2 days ago, decreased movement/ambulation, ER workup noted for left femoral neck fracture.   Clinical Assessment and Goals of Care: [name] is resting in bed. She has dementia at baseline. She sleeps intermittently waking up and speaking inaudibly and reaching to ceiling. Her daughter [name] is present at bedside. She states prior to this hospitalization, she lived at a facility and she would receive a phone call that her mother fell almost every day. She could not feed herself and at times held food in her mouth and would not swallow without coaching.   Patient's hip was fixed yesterday from a fall. Her daughter states she is surprised her mother made it through the procedure. She states her mother  was "chatty" yesterday, but was speaking to relatives who had already passed and yelled out, "wait for me". [name] states her son came yesterday to provide assurance to his grandmother that he would be okay if she were to pass.    Patient was too lethargic to work with PT today, she has only received Tylenol for pain. She has only eaten a few bites of applesauce.    We discussed her diagnosis, prognosis, GOC, EOL wishes disposition and options.  A detailed discussion was had today regarding advanced directives.  Concepts specific to code status, artifical feeding and hydration, continued IV antibiotics and rehospitalization was had.  The difference between an aggressive medical  intervention path and a hospice comfort care path was discussed.  Values and goals of care important to patient and family were discussed.  MOST form completed for comfort care measures.   [name] states she just wants her mother to be comfortable. She does not want any measures that could cause pain or suffering. She states her mother told her she is tired, and she is ready to go. She states "I am not selfish, I can let her go", and and does not want her to suffer.  DAUGHTER IS DECISION MAKER, PATIENT HAS DEMENTIA.     SUMMARY OF RECOMMENDATIONS   Inpatient hospice.    Code Status/Advance Care Planning:  DNR    Symptom Management:   EOL order set in place.  Palliative Prophylaxis:   Aspiration and Oral Care  Additional Recommendations (Limitations, Scope, Preferences):  Full Comfort Care, Initiate Comfort Feeding, No Artificial Feeding, No Blood Transfusions, No Diagnostics, No Glucose Monitoring, No IV Antibiotics, No IV Fluids, No Lab Draws and No Tracheostomy  Psycho-social/Spiritual:   Desire for further Chaplaincy support:they are following patient  Prognosis:   < 2 weeks Patient has dementia. Lethargic. PO intake to a few bites,  sips. Hip repair yesterday.   Discharge Planning: Hospice facility      Primary Diagnoses: Present on Admission: . Hip fx (HCC)   I have reviewed the medical record, interviewed the patient and family, and examined the patient. The following aspects are pertinent.  Past Medical History:  Diagnosis Date  . Alzheimer's dementia   . Arthritis   . Thyroid disease    hypothyroidism   Social History   Socioeconomic History  . Marital status: Widowed    Spouse name: None  . Number of children: None  . Years of education: None  . Highest education level: None  Social Needs  . Financial resource strain: None  . Food insecurity - worry: None  . Food insecurity - inability: None  . Transportation needs - medical: None  .  Transportation needs - non-medical: None  Occupational History  . None  Tobacco Use  . Smoking status: Never Smoker  . Smokeless tobacco: Never Used  Substance and Sexual Activity  . Alcohol use: No  . Drug use: No  . Sexual activity: No  Other Topics Concern  . None  Social History Narrative  . None   Family History  Problem Relation Age of Onset  . Dementia Mother   . Depression Father   . Dementia Sister   . Colon cancer Sister    Scheduled Meds: . calcium-vitamin D  1 tablet Oral TID  . docusate sodium  100 mg Oral BID  . [START ON 03/06/2017] enoxaparin (LOVENOX) injection  40 mg Subcutaneous Q24H  . feeding supplement (ENSURE ENLIVE)  237 mL Oral BID BM  . ferrous sulfate  325 mg Oral TID PC  . levothyroxine  125 mcg Oral QAC breakfast  . memantine  5 mg Oral BID  . multivitamin with minerals  1 tablet Oral Daily  . risperiDONE  0.25 mg Oral QHS  . senna  1 tablet Oral BID  . sodium chloride flush  3 mL Intravenous Q12H  . traZODone  25 mg Oral QHS   Continuous Infusions: . sodium chloride 75 mL/hr (03/05/17 0848)  . cefTRIAXone (ROCEPHIN)  IV Stopped (03/05/17 1305)  . methocarbamol (ROBAXIN)  IV     PRN Meds:.acetaminophen **OR** acetaminophen, bisacodyl, liver oil-zinc oxide, magnesium citrate, methocarbamol **OR** methocarbamol (ROBAXIN)  IV, morphine injection, ondansetron **OR** ondansetron (ZOFRAN) IV, oxyCODONE, phenylephrine-shark liver oil-mineral oil-petrolatum, polyethylene glycol Medications Prior to Admission:  Prior to Admission medications   Medication Sig Start Date End Date Taking? Authorizing Provider  Cholecalciferol (VITAMIN D3) 2000 units TABS Take 2,000 Units by mouth daily.   Yes [provider]  Levothyroxine Sodium 125 MCG CAPS Take 125 mcg by mouth daily before breakfast.    Yes [provider]  memantine (NAMENDA) 5 MG tablet Take 5 mg by mouth 2 (two) times daily.    Yes [provider]  mirtazapine  (REMERON) 7.5 MG tablet Take 7.5 mg by mouth at bedtime.   Yes [provider]  nitrofurantoin (MACRODANTIN) 100 MG capsule Take 100 mg by mouth at bedtime.   Yes [provider]  Risperidone 0.25 MG TBDP Take 0.25 mg by mouth at bedtime.    Yes [provider]  traZODone (DESYREL) 50 MG tablet Take 25 mg by mouth at bedtime.   Yes [provider]  cephALEXin (KEFLEX) 500 MG capsule Take 1 capsule (500 mg total) by mouth every 12 (twelve) hours. Patient not taking: Reported on 03/04/2017 01/15/17   Enedina FinnerPatel, Sona, MD  galantamine (RAZADYNE)  8 MG tablet Take 1 tablet (8 mg total) by mouth daily. Patient not taking: Reported on 03/11/2016 02/28/16   Maple HudsonGilbert, Richard L Jr., MD  levothyroxine (SYNTHROID, LEVOTHROID) 25 MCG tablet Take 1 tablet (25 mcg total) by mouth daily before breakfast. Patient not taking: Reported on 03/11/2016 03/01/16   Maple HudsonGilbert, Richard L Jr., MD  phenylephrine-shark liver oil-mineral oil-petrolatum (PREPARATION H) 0.25-3-14-71.9 % rectal ointment Place 1 application rectally every 4 (four) hours as needed for hemorrhoids.    [provider]  zinc oxide 20 % ointment Apply 1 application topically as needed for irritation.    [provider]   Allergies  Allergen Reactions  . Venlafaxine Hcl Other (See Comments)    Hallucinations, combative behavior   Review of Systems  Unable to perform ROS   Physical Exam  Constitutional: No distress.  Sleeping and then reaching to ceiling.   Pulmonary/Chest: Effort normal.  Skin: Skin is warm and dry.    Vital Signs: BP (!) 123/44 (BP Location: Left Arm)   Pulse 85   Temp 99.4 F (37.4 C) (Oral)   Resp 20   Ht 5' (1.524 m)   Wt 49 kg (108 lb)   SpO2 (!) 84%   BMI 21.09 kg/m  Pain Assessment: PAINAD POSS *See Group Information*: 2-Acceptable,Slightly drowsy, easily aroused Pain Score: Asleep   IO: Intake/output summary:   Intake/Output Summary (Last 24 hours) at  03/05/2017 1429 Last data filed at 03/05/2017 1100 Gross per 24 hour  Intake 3785 ml  Output 1175 ml  Net 2610 ml    LBM: Last BM Date: (PTA) Baseline Weight: Weight: 51.7 kg (114 lb) Most recent weight: Weight: 49 kg (108 lb)     Palliative Assessment/Data: 20     Time In: 2:50 Time Out: 4:00 Time Total: 70 min Greater than 50%  of this time was spent counseling and coordinating care related to the above assessment and plan.  Signed by: Morton Stallrystal Jaya Lapka, NP 03/05/2017 3:59 PM Office: [phone] 7am-7pm  Please see Amion for pager number and availability  Call primary team after hours   Please contact Palliative Medicine Team phone at [phone] for questions and concerns.  For individual provider: See Loretha StaplerAmion

## 2017-03-06 LAB — SURGICAL PATHOLOGY

## 2017-03-06 MED ORDER — HALOPERIDOL 0.5 MG PO TABS: 0.5000 mg | ORAL_TABLET | ORAL | 0 refills | Status: AC | PRN

## 2017-03-06 MED ORDER — GLYCOPYRROLATE 1 MG PO TABS: 1.0000 mg | ORAL_TABLET | ORAL | 0 refills | Status: AC | PRN

## 2017-03-06 NOTE — Progress Notes (Signed)
Follow up visit made to new hospice home referral. Patient seen lying in bed, remains responsive to voice only, not eating or drinking. Daughter [name] and son in Teacher, musiclaw Gary at bedside. Plan remains for transport to the hospice home this morning, family aware and in agreement. Hospital care team all aware. Report called to the Hospice home. EMS notified for transport.  Signed out of facility DNR in place in discharge packet. Discharge summary faxed to referral. Dayna BarkerKaren Robertson RN, BSN, [facility], [facility] [phone]

## 2017-03-06 NOTE — Progress Notes (Signed)
OT Cancellation Note  Patient Details Name: Alejandra GauzeLuz Maria Armstrong MRN: [id number] DOB: 06/04/1929   Cancelled Treatment:    Reason Eval/Treat Not Completed: Patient not medically ready. Pt continues to be medically inappropriate for OT evaluation. Per chart review, plan for transfer today to hospice home. Will discontinue OT orders at this time.   Richrd PrimeJamie Stiller, MPH, MS, OTR/L ascom [phone] 03/06/17, 9:00 AM

## 2017-03-06 NOTE — Progress Notes (Signed)
PT Cancellation Note  Patient Details Name: Alejandra Armstrong MRN: [id number] DOB: 05/30/1929   Cancelled Treatment:    Reason Eval/Treat Not Completed: Patient not medically ready.  Per chart review, plan for pt to transfer to hospice home today.  [facility]  D/t current POC, will discontinue current PT order.  Hendricks LimesEmily Constance Hackenberg, PT 03/06/17, 9:40 AM [phone]

## 2017-03-06 NOTE — Discharge Summary (Signed)
Alejandra Armstrong, is a 83 y.o. female  DOB 1930/03/24  MRN [id number].  Admission date:  03/03/2017  Admitting Physician  Bertrum Sol, MD  Discharge Date:  03/06/2017   Primary MD  Maple Hudson., MD  Recommendations for primary care physician for things to follow:   Is going to hospice home today.   Admission Diagnosis  Left hip pain [M25.552] Closed fracture of neck of left femur, initial encounter (HCC) [S72.002A]   Discharge Diagnosis  Left hip pain [M25.552] Closed fracture of neck of left femur, initial encounter (HCC) [S72.002A]    Active Problems:   Hip fx (HCC)      Past Medical History:  Diagnosis Date  . Alzheimer's dementia   . Arthritis   . Thyroid disease    hypothyroidism    Past Surgical History:  Procedure Laterality Date  . ABDOMINAL HYSTERECTOMY    . HIP ARTHROPLASTY Left 03/04/2017   Procedure: ARTHROPLASTY BIPOLAR HIP (HEMIARTHROPLASTY);  Surgeon: Juanell Fairly, MD;  Location: [facility] ORS;  Service: Orthopedics;  Laterality: Left;       History of present illness and  Hospital Course:     Kindly see H&P for history of present illness and admission details, please review complete Labs, Consult reports and Test reports for all details in brief  HPI  from the history and physical done on 83 year old female patient with history of dementia came into the hospital because of the fall 2 days ago and fell with decreased movement and ambulation in the emergency room, patient x-ray showed left femoral neck fracture.   Hospital Course  #1 acute left left femoral neck fracture status post left hip area arthroplasty.  Patient has severe dementia,, postoperative course complicated by worsening confusion, patient is seen by palliative care,, daughter [name] requested comfort measures,  hospice home placement, patient is going to hospice home today. 2.  Advanced Alzheimer's dementia with poor p.o. intake.  Family chose full comfort measures.     Discharge Condition:stable   Follow UP  Contact information for after-discharge care    Destination    [facility] .   Service:  Skilled Nursing Contact information: [address] [place] [address] [phone]                Discharge Instructions  and  Discharge Medications     Allergies as of 03/06/2017      Reactions   Venlafaxine Hcl Other (See Comments)   Hallucinations, combative behavior      Medication List    STOP taking these medications   cephALEXin 500 MG capsule Commonly known as:  KEFLEX   galantamine 8 MG tablet Commonly known as:  RAZADYNE   levothyroxine 25 MCG tablet Commonly known as:  SYNTHROID, LEVOTHROID   Levothyroxine Sodium 125 MCG Caps   memantine 5 MG tablet Commonly known as:  NAMENDA   mirtazapine 7.5 MG tablet Commonly known as:  REMERON   nitrofurantoin 100 MG capsule Commonly known as:  MACRODANTIN   phenylephrine-shark liver oil-mineral oil-petrolatum 0.25-3-14-71.9 % rectal ointment Commonly known as:  PREPARATION H   Risperidone 0.25 MG Tbdp   sulfamethoxazole-trimethoprim 800-160 MG tablet Commonly known as:  BACTRIM DS,SEPTRA DS   traZODone 50 MG tablet Commonly known as:  DESYREL   Vitamin D3 2000 units Tabs   zinc oxide 20 % ointment     TAKE these medications   glycopyrrolate 1 MG tablet Commonly known as:  ROBINUL Take 1  tablet (1 mg total) by mouth every 4 (four) hours as needed (excessive secretions).   haloperidol 0.5 MG tablet Commonly known as:  HALDOL Take 1 tablet (0.5 mg total) by mouth every 4 (four) hours as needed for agitation (or delirium).         Diet and Activity recommendation: See Discharge Instructions above   Consults obtained -palliative care,  hospice.   Major procedures and Radiology Reports - PLEASE review detailed and final reports for all details, in brief -      Dg Chest 1 View  Result Date: 03/05/2017 CLINICAL DATA:  Fever EXAM: CHEST 1 VIEW COMPARISON:  03/03/2017 FINDINGS: Low lung volumes. Bibasilar atelectasis. Heart is normal size. No effusions. Aorta is calcified, nonaneurysmal. IMPRESSION: Low lung volumes, bibasilar atelectasis. Electronically Signed   By: Charlett NoseKevin  Dover M.D.   On: 03/05/2017 10:37   Dg Chest 1 View  Result Date: 03/03/2017 CLINICAL DATA:  Recent fall with known left hip fracture EXAM: CHEST 1 VIEW COMPARISON:  01/14/2017 FINDINGS: Cardiac shadow is within normal limits. Aortic calcifications are seen. The lungs are well aerated bilaterally. Degenerative changes of the thoracic spine are seen. No acute abnormality noted. IMPRESSION: No acute abnormality noted. Electronically Signed   By: Alcide CleverMark  Lukens M.D.   On: 03/03/2017 18:59   Ct Head Wo Contrast  Result Date: 03/05/2017 CLINICAL DATA:  Altered level of consciousness.  Underlying dementia EXAM: CT HEAD WITHOUT CONTRAST TECHNIQUE: Contiguous axial images were obtained from the base of the skull through the vertex without intravenous contrast. COMPARISON:  February 01, 2017 FINDINGS: Brain: There is stable moderate diffuse atrophy. There is no intracranial mass, hemorrhage, extra-axial fluid collection, midline shift. There is small vessel disease scattered throughout the centra semiovale bilaterally. There is small vessel disease throughout the left internal capsule and to a lesser extent in the right internal capsule. There is no appreciable acute infarct. Vascular: There is no hyperdense vessel. There is calcification in each carotid siphon region and each distal vertebral artery. Skull: Bony calvarium appears intact. Sinuses/Orbits: Visualized paranasal sinuses are clear. Visualized orbits appear symmetric bilaterally. Other: Visualized mastoid air  cells are clear. IMPRESSION: Stable atrophy with extensive small vessel disease. No acute infarct evident. No mass or hemorrhage. There are foci of arterial vascular calcification at multiple sites. Electronically Signed   By: Bretta BangWilliam  Woodruff III M.D.   On: 03/05/2017 12:19   Dg Hip Port Unilat With Pelvis 1v Left  Result Date: 03/04/2017 CLINICAL DATA:  Left hip replacement EXAM: DG HIP (WITH OR WITHOUT PELVIS) 1V PORT LEFT COMPARISON:  None. FINDINGS: Generalized osteopenia. Left hip arthroplasty without failure complication. Postsurgical changes in the surrounding soft tissues. No acute fracture or dislocation. IMPRESSION: Interval left hip arthroplasty. Electronically Signed   By: Elige KoHetal  Patel   On: 03/04/2017 15:55   Dg Hip Unilat W Or Wo Pelvis 2-3 Views Left  Result Date: 03/03/2017 CLINICAL DATA:  Fall 2 days ago with left hip pain, initial encounter EXAM: DG HIP (WITH OR WITHOUT PELVIS) 2-3V LEFT COMPARISON:  None. FINDINGS: The pelvic ring is intact. There is a subcapital femoral neck fracture on the left with impaction and angulation at the fracture site. Femoral head is well seated. A vague lucency is noted over the inferior pubic ramus on the left which may represent an undisplaced fracture. No other focal abnormality is seen. IMPRESSION: Subcapital femoral neck fracture on the left. Suspicious lucency in the left inferior pubic ramus which may represent an undisplaced fracture. Electronically  Signed   By: Alcide CleverMark  Lukens M.D.   On: 03/03/2017 18:58    Micro Results   Recent Results (from the past 240 hour(s))  Surgical pcr screen     Status: None   Collection Time: 03/03/17 11:17 PM  Result Value Ref Range Status   MRSA, PCR NEGATIVE NEGATIVE Final   Staphylococcus aureus NEGATIVE NEGATIVE Final    Comment: (NOTE) The Xpert SA Assay (FDA approved for NASAL specimens in patients 81 years of age and older), is one component of a comprehensive surveillance program. It is not  intended to diagnose infection nor to guide or monitor treatment.        Today   Subjective:   Tasia CatchingsLuz Segel today stablefor discharge to hospice home. Objective:   Blood pressure (!) 123/44, pulse 85, temperature 99.4 F (37.4 C), temperature source Oral, resp. rate 20, height 5' (1.524 m), weight 49 kg (108 lb), SpO2 92 %.   Intake/Output Summary (Last 24 hours) at 03/06/2017 0929 Last data filed at 03/06/2017 0631 Gross per 24 hour  Intake 1610.5 ml  Output 525 ml  Net 1085.5 ml    Exam Advanced  Alzheimer's dementia, [place].AT,PERRAL Supple Neck,No JVD, No cervical lymphadenopathy appriciated.  Symmetrical Chest wall movement, Good air movement bilaterally, CTAB RRR,No Gallops,Rubs or new Murmurs, No Parasternal Heave +ve B.Sounds, Abd Soft, Non tender, No organomegaly appriciated, No rebound -guarding or rigidity. No Cyanosis, Clubbing or edema, No new Rash or bruise  Data Review   CBC w Diff:  Lab Results  Component Value Date   WBC 9.4 03/05/2017   HGB 10.6 (L) 03/05/2017   HGB 13.1 02/28/2016   HCT 32.2 (L) 03/05/2017   HCT 40.4 02/28/2016   PLT 213 03/05/2017   PLT 291 02/28/2016   LYMPHOPCT 5 03/03/2017   MONOPCT 5 03/03/2017   EOSPCT 1 03/03/2017   BASOPCT 0 03/03/2017    CMP:  Lab Results  Component Value Date   NA 133 (L) 03/05/2017   NA 146 (H) 02/28/2016   K 4.3 03/05/2017   CL 104 03/05/2017   CO2 23 03/05/2017   BUN 17 03/05/2017   BUN 29 (H) 02/28/2016   CREATININE 0.77 03/05/2017   PROT 7.6 03/03/2017   PROT 7.6 02/28/2016   ALBUMIN 3.5 03/03/2017   ALBUMIN 4.7 02/28/2016   BILITOT 0.5 03/03/2017   BILITOT 0.3 02/28/2016   ALKPHOS 88 03/03/2017   AST 37 03/03/2017   ALT 25 03/03/2017  .   Total Time in preparing paper work, data evaluation and todays exam - 35 minutes  Katha HammingSnehalatha Diontre Harps M.D on 03/06/2017 at 9:29 AM    Note: This dictation was prepared with Dragon dictation along with smaller phrase technology. Any  transcriptional errors that result from this process are unintentional.

## 2017-03-06 NOTE — Progress Notes (Signed)
SUBJECTIVE: comfortable   Vitals:   03/05/17 0410 03/05/17 0849 03/05/17 1018 03/05/17 1258  BP: (!) 115/44 (!) 140/95 (!) 108/48 (!) 123/44  Pulse: (!) 106 (!) 110 (!) 108 85  Resp: 16 17 20 20   Temp: 98.5 F (36.9 C) 100.3 F (37.9 C) 100 F (37.8 C) 99.4 F (37.4 C)  TempSrc: Oral Axillary Oral Oral  SpO2: 95% 93% 93% 92%  Weight:      Height:        Intake/Output Summary (Last 24 hours) at 03/06/2017 0801 Last data filed at 03/06/2017 0631 Gross per 24 hour  Intake 1670.5 ml  Output 700 ml  Net 970.5 ml    LABS: Basic Metabolic Panel: Recent Labs    03/04/17 0112 03/05/17 0545  NA 134* 133*  K 4.3 4.3  CL 101 104  CO2 23 23  GLUCOSE 156* 112*  BUN 43* 17  CREATININE 0.93 0.77  CALCIUM 9.2 8.5*   Liver Function Tests: Recent Labs    03/03/17 1921  AST 37  ALT 25  ALKPHOS 88  BILITOT 0.5  PROT 7.6  ALBUMIN 3.5   No results for input(s): LIPASE, AMYLASE in the last 72 hours. CBC: Recent Labs    03/03/17 1921 03/04/17 0112 03/05/17 0545  WBC 12.7* 10.4 9.4  NEUTROABS 11.3*  --   --   HGB 11.8* 10.6* 10.6*  HCT 36.7 32.2* 32.2*  MCV 82.0 79.6* 81.0  PLT 257 230 213   Cardiac Enzymes: Recent Labs    03/03/17 1921 03/04/17 0112 03/04/17 0712  TROPONINI <0.03 <0.03 0.03*   BNP: Invalid input(s): POCBNP D-Dimer: No results for input(s): DDIMER in the last 72 hours. Hemoglobin A1C: No results for input(s): HGBA1C in the last 72 hours. Fasting Lipid Panel: No results for input(s): CHOL, HDL, LDLCALC, TRIG, CHOLHDL, LDLDIRECT in the last 72 hours. Thyroid Function Tests: Recent Labs    03/04/17 0112  TSH 0.851   Anemia Panel: No results for input(s): VITAMINB12, FOLATE, FERRITIN, TIBC, IRON, RETICCTPCT in the last 72 hours.   PHYSICAL EXAM General: Well developed, well nourished, in no acute distress HEENT:  Normocephalic and atramatic Neck:  No JVD.  Lungs: Clear bilaterally to auscultation and percussion. Heart: HRRR . Normal  S1 and S2 without gallops or murmurs.  Abdomen: Bowel sounds are positive, abdomen soft and non-tender  Msk:  Back normal, normal gait. Normal strength and tone for age. Extremities: No clubbing, cyanosis or edema.   Neuro: Alert and oriented X 3. Psych:  Good affect, responds appropriately  TELEMETRY: NSR  ASSESSMENT AND PLAN: s/p hip surgery , doing well.  Active Problems:   Hip fx (HCC)    Zalika Tieszen A, MD, [facility] HospitalFACC 03/06/2017 8:01 AM

## 2017-03-06 NOTE — Progress Notes (Signed)
Per Alejandra LudwigKaren [place] [facility] liaison patient [facility] [place] today. Alejandra Armstrong has [name]. Clinical Child psychotherapistocial Worker (CSW) prepared D/C packet including DNR. Patient's daughter Alejandra Armstrong is at bedside and aware of above. RN and MD aware of above. Please reconsult if future social work needs arise. CSW signing off.   Baker Hughes IncorporatedBailey Nakeda Lebron, LCSW  [phone]

## 2017-03-06 NOTE — Care Management Important Message (Signed)
Important Message  Patient Details  Name: Alejandra Armstrong MRN: [id number] Date of Birth: 07/07/1929   Medicare Important Message Given:  Yes    Marily MemosLisa M Shirely Toren, RN 03/06/2017, 9:49 AM

## 2017-03-06 NOTE — Progress Notes (Signed)
Pt discharged to Hospice facility per order without incident via stretcher by non-emergency transport. Daughter at bedside, aware of plan of care and very supportive. No change in pt from AM assessment. Pain controlled on discharge.

## 2017-03-25 DEATH — deceased

## 2017-12-05 IMAGING — DX DG CHEST 1V PORT
1 series · 1 of 1 positions shown · non-contrast
Comparison: None available.

CLINICAL DATA: Initial evaluation for acute syncope.

EXAM:
PORTABLE CHEST 1 VIEW

[chest ap]
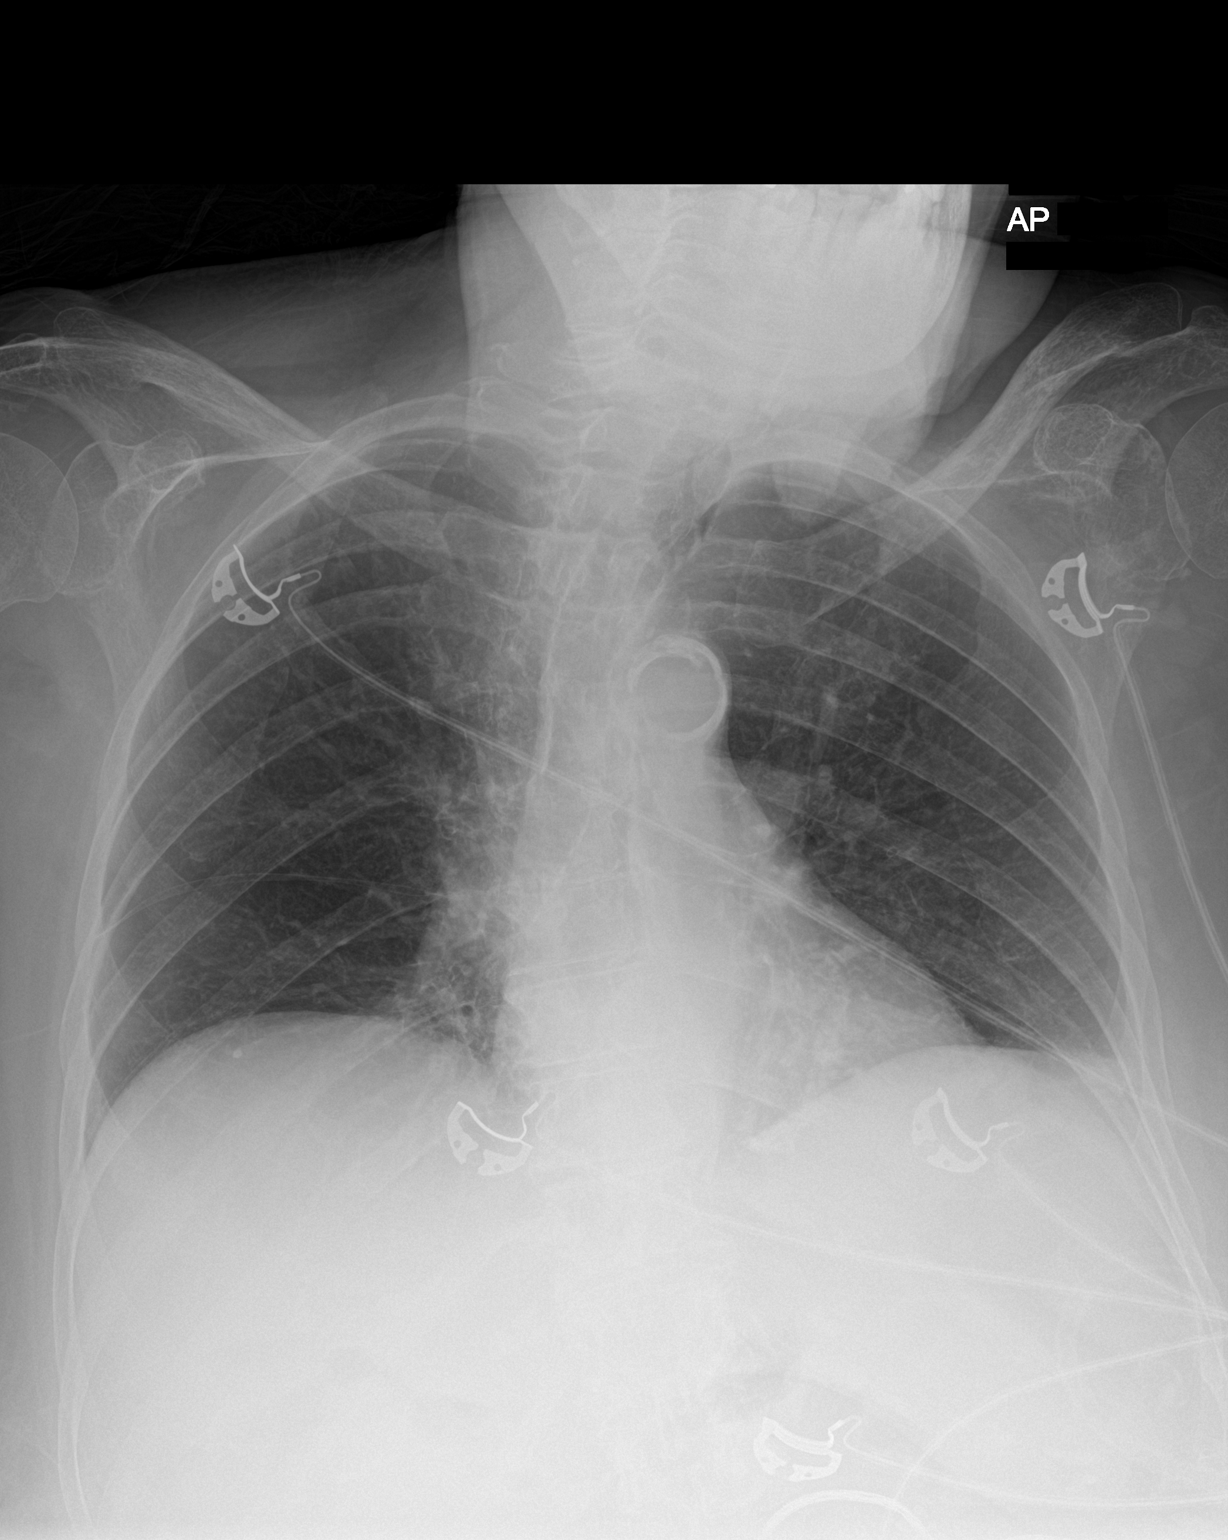

[1 of 1 positions shown; findings below may reference images not displayed]

FINDINGS: Heart size within normal limits. Mediastinal silhouette normal.
Prominent aortic atherosclerosis.

Lungs hypoinflated. No focal infiltrates. No pulmonary edema or
pleural effusion. No pneumothorax.

No acute osseus abnormality.
IMPRESSION: 1. No active cardiopulmonary disease.
2. Aortic atherosclerosis.

## 2017-12-05 IMAGING — CT CT HEAD W/O CM
3 series · 16 of 47 positions shown, 19 images · non-contrast
Comparison: None.

CLINICAL DATA: Syncopal episode. Alzheimer's disease. Altered
mental status.

EXAM:
CT HEAD WITHOUT CONTRAST
TECHNIQUE: Contiguous axial images were obtained from the base of the skull
through the vertex without intravenous contrast.

[Series 2: head wo · axial · 0.42mm/px · z∈[-81,+44]mm · 10 of 31 slices shown, 13 images]
[im 3/31  brain]
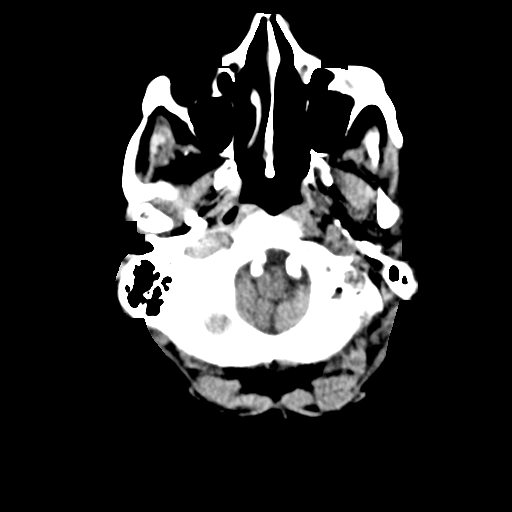
[im 3/31  bone]
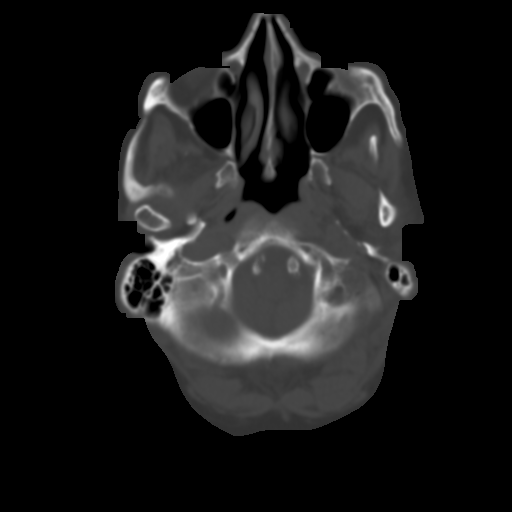
[im 6/31  brain]
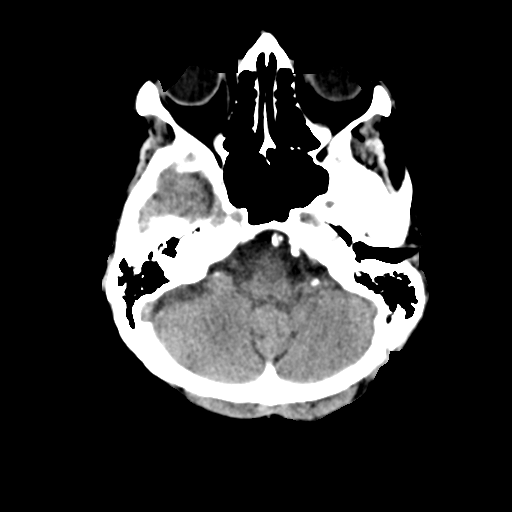
[im 9/31  brain]
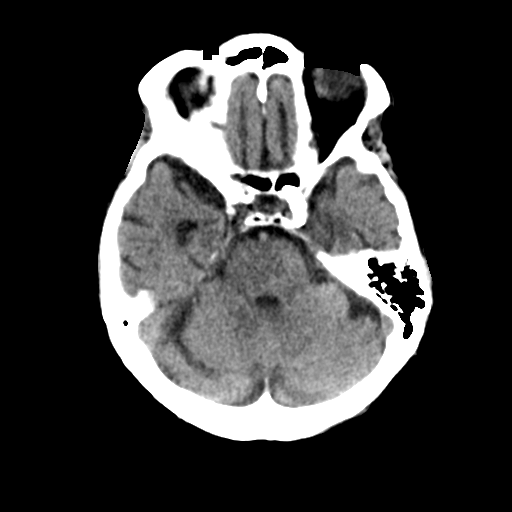
[im 11/31  brain]
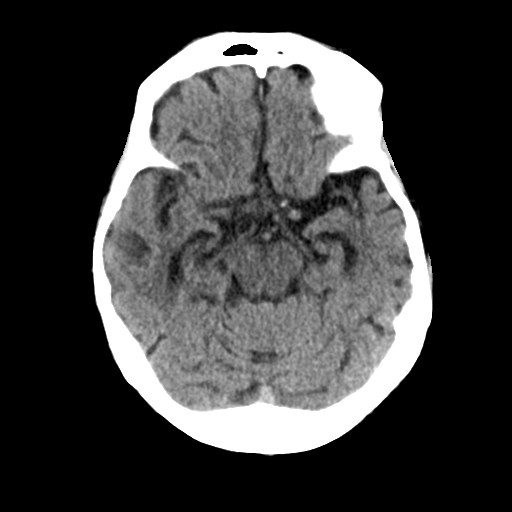
[im 14/31  brain]
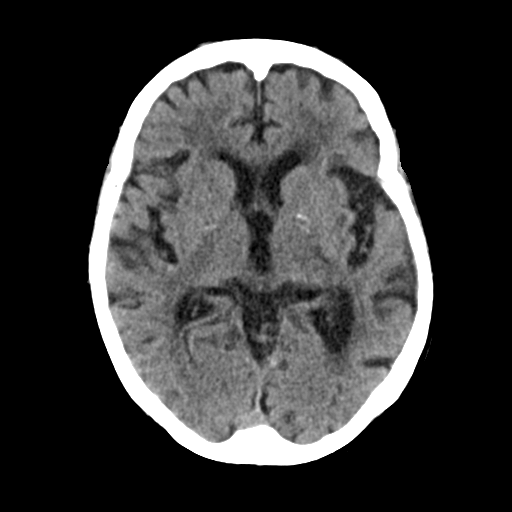
[im 14/31  bone]
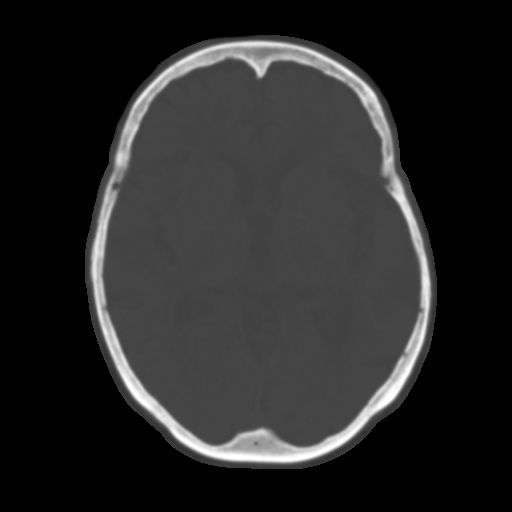
[im 17/31  brain]
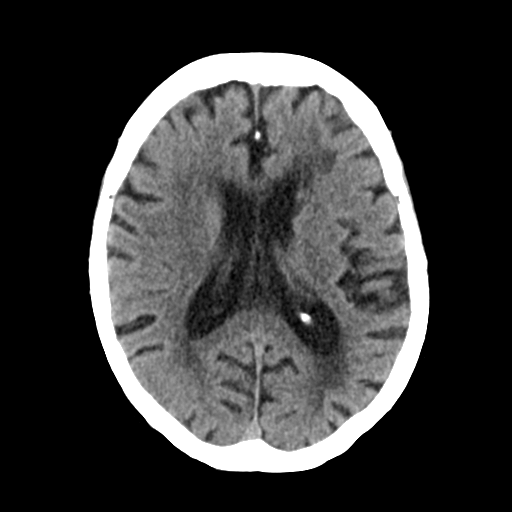
[im 20/31  brain]
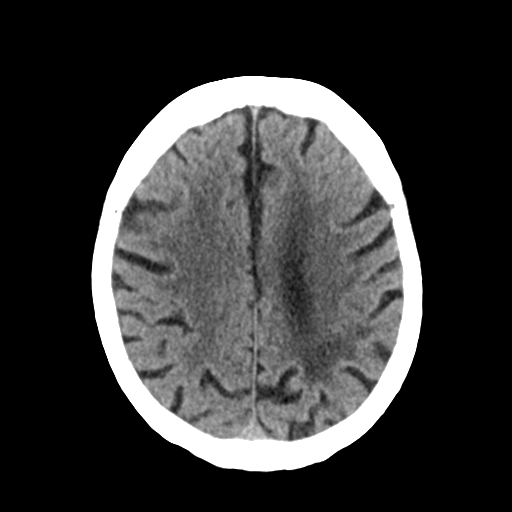
[im 23/31  brain]
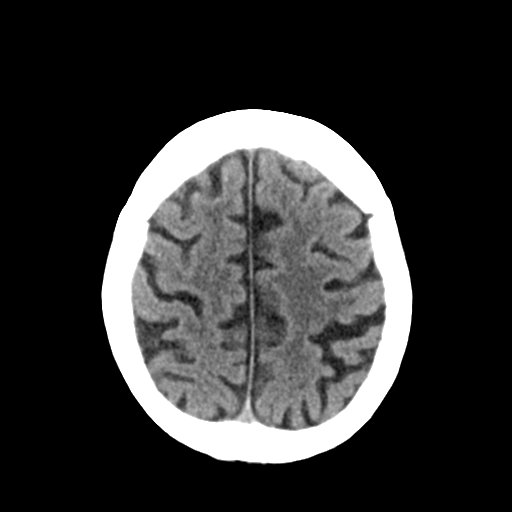
[im 25/31  brain]
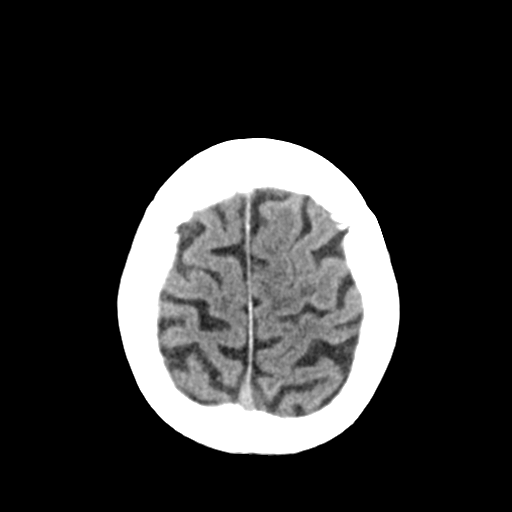
[im 25/31  bone]
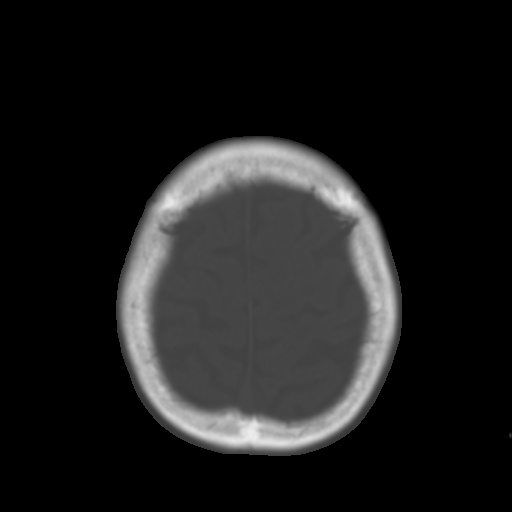
[im 28/31  brain]
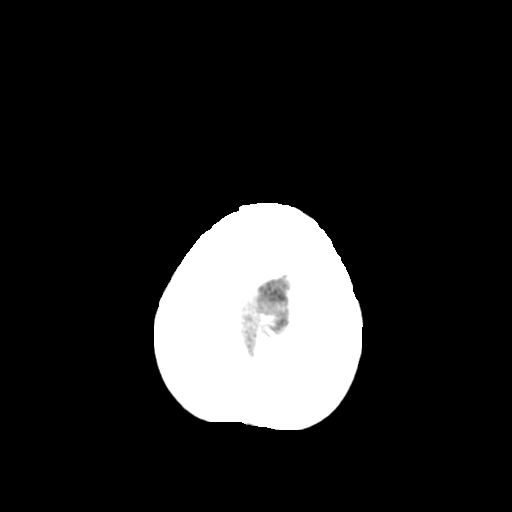

[Series 4: coronal soft tissue · coronal · 0.31mm/px · 3 of 62 slices shown]
[im 21/62  brain]
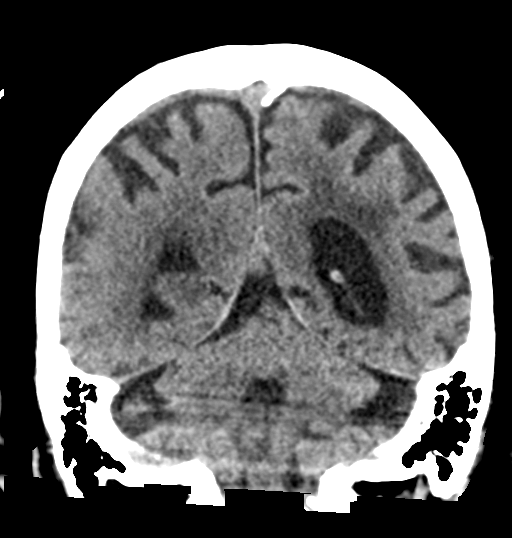
[im 28/62  brain]
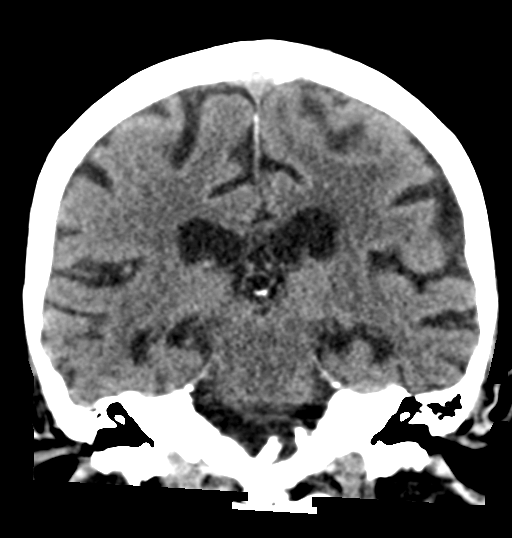
[im 34/62  brain]
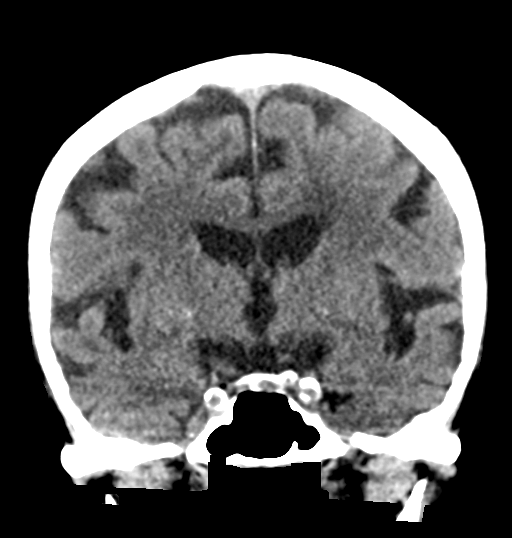

[Series 5: sagittal soft tissue · sagittal · 0.32mm/px · 3 of 51 slices shown]
[im 17/51  brain]
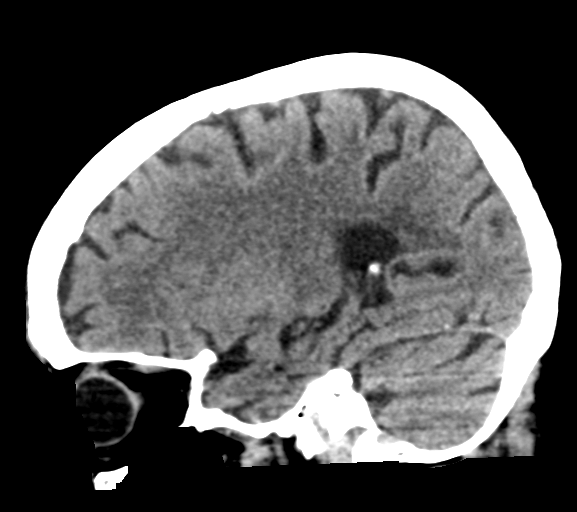
[im 26/51  brain]
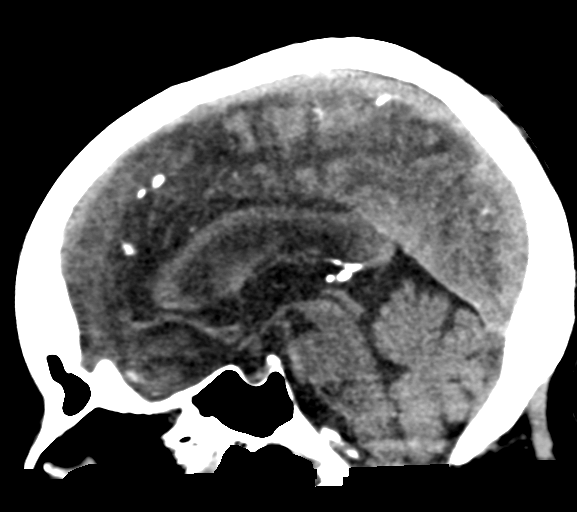
[im 34/51  brain]
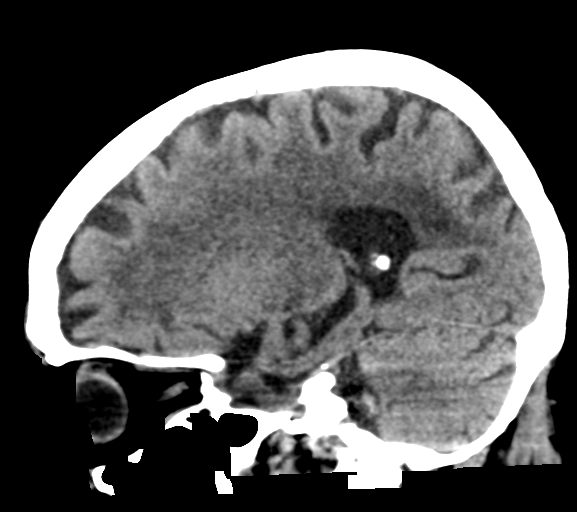

[16 of 47 positions shown; findings below may reference images not displayed]

FINDINGS: Brain: Diffusely enlarged ventricles and subarachnoid spaces. Patchy
white matter low density in both cerebral hemispheres. No
intracranial hemorrhage, mass lesion or CT evidence of acute
infarction.

Vascular: No hyperdense vessel or unexpected calcification.

Skull: Normal. Negative for fracture or focal lesion.

Sinuses/Orbits: Status post bilateral cataract extraction.

Other: None.
IMPRESSION: 1. No acute abnormality.
2. Mild to moderate diffuse cerebral and cerebellar atrophy.
3. Moderate chronic small vessel white matter ischemic changes in
both cerebral hemispheres.
